# Patient Record
Sex: Female | Born: 1960 | Race: White | Hispanic: No | Marital: Married | Smoking: Never smoker
Health system: Southern US, Community
[De-identification: ages and names within clinical notes are randomized; demographics above are authoritative.]

## PROBLEM LIST (undated history)

## (undated) DIAGNOSIS — G4700 Insomnia, unspecified: Secondary | ICD-10-CM

## (undated) DIAGNOSIS — I071 Rheumatic tricuspid insufficiency: Secondary | ICD-10-CM

## (undated) DIAGNOSIS — M3500 Sicca syndrome, unspecified: Secondary | ICD-10-CM

## (undated) DIAGNOSIS — K760 Fatty (change of) liver, not elsewhere classified: Secondary | ICD-10-CM

## (undated) DIAGNOSIS — G2581 Restless legs syndrome: Secondary | ICD-10-CM

## (undated) DIAGNOSIS — E538 Deficiency of other specified B group vitamins: Secondary | ICD-10-CM

## (undated) DIAGNOSIS — H04123 Dry eye syndrome of bilateral lacrimal glands: Secondary | ICD-10-CM

## (undated) DIAGNOSIS — J309 Allergic rhinitis, unspecified: Secondary | ICD-10-CM

## (undated) DIAGNOSIS — E039 Hypothyroidism, unspecified: Secondary | ICD-10-CM

## (undated) DIAGNOSIS — M2550 Pain in unspecified joint: Secondary | ICD-10-CM

## (undated) DIAGNOSIS — R12 Heartburn: Secondary | ICD-10-CM

## (undated) DIAGNOSIS — F419 Anxiety disorder, unspecified: Secondary | ICD-10-CM

## (undated) DIAGNOSIS — Z9889 Other specified postprocedural states: Secondary | ICD-10-CM

## (undated) DIAGNOSIS — R0602 Shortness of breath: Secondary | ICD-10-CM

## (undated) DIAGNOSIS — M329 Systemic lupus erythematosus, unspecified: Secondary | ICD-10-CM

## (undated) DIAGNOSIS — J189 Pneumonia, unspecified organism: Secondary | ICD-10-CM

## (undated) DIAGNOSIS — E079 Disorder of thyroid, unspecified: Secondary | ICD-10-CM

## (undated) DIAGNOSIS — J302 Other seasonal allergic rhinitis: Secondary | ICD-10-CM

## (undated) DIAGNOSIS — K829 Disease of gallbladder, unspecified: Secondary | ICD-10-CM

## (undated) DIAGNOSIS — E559 Vitamin D deficiency, unspecified: Secondary | ICD-10-CM

## (undated) DIAGNOSIS — R001 Bradycardia, unspecified: Secondary | ICD-10-CM

## (undated) DIAGNOSIS — I10 Essential (primary) hypertension: Secondary | ICD-10-CM

## (undated) DIAGNOSIS — IMO0002 Reserved for concepts with insufficient information to code with codable children: Secondary | ICD-10-CM

## (undated) DIAGNOSIS — E7800 Pure hypercholesterolemia, unspecified: Secondary | ICD-10-CM

## (undated) DIAGNOSIS — I7300 Raynaud's syndrome without gangrene: Secondary | ICD-10-CM

## (undated) DIAGNOSIS — R7303 Prediabetes: Secondary | ICD-10-CM

## (undated) HISTORY — DX: Disorder of thyroid, unspecified: E07.9

## (undated) HISTORY — DX: Other seasonal allergic rhinitis: J30.2

## (undated) HISTORY — DX: Systemic lupus erythematosus, unspecified: M32.9

## (undated) HISTORY — DX: Sjogren syndrome, unspecified: M35.00

## (undated) HISTORY — DX: Deficiency of other specified B group vitamins: E53.8

## (undated) HISTORY — DX: Insomnia, unspecified: G47.00

## (undated) HISTORY — DX: Vitamin D deficiency, unspecified: E55.9

## (undated) HISTORY — PX: TONSILLECTOMY: SUR1361

## (undated) HISTORY — PX: NASAL SINUS SURGERY: SHX719

## (undated) HISTORY — DX: Fatty (change of) liver, not elsewhere classified: K76.0

## (undated) HISTORY — DX: Allergic rhinitis, unspecified: J30.9

## (undated) HISTORY — DX: Heartburn: R12

## (undated) HISTORY — DX: Essential (primary) hypertension: I10

## (undated) HISTORY — DX: Shortness of breath: R06.02

## (undated) HISTORY — DX: Bradycardia, unspecified: R00.1

## (undated) HISTORY — DX: Pure hypercholesterolemia, unspecified: E78.00

## (undated) HISTORY — DX: Pain in unspecified joint: M25.50

## (undated) HISTORY — DX: Disease of gallbladder, unspecified: K82.9

## (undated) HISTORY — PX: BREAST REDUCTION SURGERY: SHX8

## (undated) HISTORY — DX: Restless legs syndrome: G25.81

## (undated) HISTORY — DX: Anxiety disorder, unspecified: F41.9

## (undated) HISTORY — DX: Other specified postprocedural states: Z98.890

## (undated) HISTORY — PX: REDUCTION MAMMAPLASTY: SUR839

## (undated) HISTORY — DX: Reserved for concepts with insufficient information to code with codable children: IMO0002

## (undated) HISTORY — DX: Raynaud's syndrome without gangrene: I73.00

## (undated) HISTORY — DX: Rheumatic tricuspid insufficiency: I07.1

## (undated) HISTORY — DX: Dry eye syndrome of bilateral lacrimal glands: H04.123

## (undated) HISTORY — PX: CHOLECYSTECTOMY: SHX55

## (undated) SURGERY — VIDEO BRONCHOSCOPY WITH ENDOBRONCHIAL NAVIGATION
Anesthesia: General | Laterality: Bilateral

---

## 1998-02-16 ENCOUNTER — Other Ambulatory Visit: Admission: RE | Admit: 1998-02-16 | Discharge: 1998-02-16 | Payer: Self-pay | Admitting: Family Medicine

## 1998-03-25 ENCOUNTER — Encounter: Payer: Self-pay | Admitting: Emergency Medicine

## 1998-03-25 ENCOUNTER — Emergency Department (HOSPITAL_COMMUNITY): Admission: EM | Admit: 1998-03-25 | Discharge: 1998-03-26 | Payer: Self-pay | Admitting: Emergency Medicine

## 1999-04-30 ENCOUNTER — Other Ambulatory Visit: Admission: RE | Admit: 1999-04-30 | Discharge: 1999-04-30 | Payer: Self-pay | Admitting: Obstetrics and Gynecology

## 1999-10-16 ENCOUNTER — Encounter: Admission: RE | Admit: 1999-10-16 | Discharge: 2000-01-14 | Payer: Self-pay | Admitting: Family Medicine

## 2000-03-03 ENCOUNTER — Encounter: Admission: RE | Admit: 2000-03-03 | Discharge: 2000-06-01 | Payer: Self-pay | Admitting: Family Medicine

## 2001-01-01 ENCOUNTER — Encounter: Payer: Self-pay | Admitting: Family Medicine

## 2001-01-01 ENCOUNTER — Ambulatory Visit (HOSPITAL_COMMUNITY): Admission: RE | Admit: 2001-01-01 | Discharge: 2001-01-01 | Payer: Self-pay | Admitting: Family Medicine

## 2001-04-20 ENCOUNTER — Ambulatory Visit (HOSPITAL_COMMUNITY): Admission: RE | Admit: 2001-04-20 | Discharge: 2001-04-20 | Payer: Self-pay | Admitting: Family Medicine

## 2001-04-20 ENCOUNTER — Encounter: Payer: Self-pay | Admitting: Family Medicine

## 2002-09-07 ENCOUNTER — Ambulatory Visit (HOSPITAL_COMMUNITY): Admission: RE | Admit: 2002-09-07 | Discharge: 2002-09-07 | Payer: Self-pay | Admitting: Family Medicine

## 2002-09-07 ENCOUNTER — Encounter: Payer: Self-pay | Admitting: Family Medicine

## 2002-12-23 ENCOUNTER — Ambulatory Visit (HOSPITAL_COMMUNITY): Admission: RE | Admit: 2002-12-23 | Discharge: 2002-12-23 | Payer: Self-pay | Admitting: Family Medicine

## 2003-01-25 ENCOUNTER — Ambulatory Visit (HOSPITAL_COMMUNITY): Admission: RE | Admit: 2003-01-25 | Discharge: 2003-01-25 | Payer: Self-pay | Admitting: Family Medicine

## 2003-02-22 ENCOUNTER — Encounter: Admission: RE | Admit: 2003-02-22 | Discharge: 2003-02-22 | Payer: Self-pay | Admitting: Family Medicine

## 2003-11-30 ENCOUNTER — Ambulatory Visit (HOSPITAL_COMMUNITY): Admission: RE | Admit: 2003-11-30 | Discharge: 2003-11-30 | Payer: Self-pay | Admitting: Family Medicine

## 2003-12-15 ENCOUNTER — Ambulatory Visit: Payer: Self-pay | Admitting: Internal Medicine

## 2003-12-20 ENCOUNTER — Ambulatory Visit (HOSPITAL_COMMUNITY): Admission: RE | Admit: 2003-12-20 | Discharge: 2003-12-20 | Payer: Self-pay | Admitting: Internal Medicine

## 2004-04-25 ENCOUNTER — Encounter: Admission: RE | Admit: 2004-04-25 | Discharge: 2004-04-25 | Payer: Self-pay | Admitting: Otolaryngology

## 2004-11-09 ENCOUNTER — Ambulatory Visit: Payer: Self-pay | Admitting: Internal Medicine

## 2005-09-13 ENCOUNTER — Encounter (INDEPENDENT_AMBULATORY_CARE_PROVIDER_SITE_OTHER): Payer: Self-pay | Admitting: Specialist

## 2005-09-13 ENCOUNTER — Ambulatory Visit (HOSPITAL_COMMUNITY): Admission: RE | Admit: 2005-09-13 | Discharge: 2005-09-13 | Payer: Self-pay | Admitting: Gastroenterology

## 2006-03-19 ENCOUNTER — Encounter: Admission: RE | Admit: 2006-03-19 | Discharge: 2006-03-19 | Payer: Self-pay | Admitting: Family Medicine

## 2007-09-18 ENCOUNTER — Encounter: Admission: RE | Admit: 2007-09-18 | Discharge: 2007-09-18 | Payer: Self-pay | Admitting: Family Medicine

## 2008-01-20 ENCOUNTER — Other Ambulatory Visit: Admission: RE | Admit: 2008-01-20 | Discharge: 2008-01-20 | Payer: Self-pay | Admitting: Family Medicine

## 2008-02-25 ENCOUNTER — Observation Stay (HOSPITAL_COMMUNITY): Admission: EM | Admit: 2008-02-25 | Discharge: 2008-02-26 | Payer: Self-pay | Admitting: Emergency Medicine

## 2010-01-28 ENCOUNTER — Encounter: Payer: Self-pay | Admitting: Otolaryngology

## 2010-04-24 LAB — CARDIAC PANEL(CRET KIN+CKTOT+MB+TROPI)
CK, MB: 0.6 ng/mL (ref 0.3–4.0)
CK, MB: 0.6 ng/mL (ref 0.3–4.0)
Relative Index: INVALID (ref 0.0–2.5)
Relative Index: INVALID (ref 0.0–2.5)
Total CK: 40 U/L (ref 7–177)
Total CK: 46 U/L (ref 7–177)
Troponin I: 0.02 ng/mL (ref 0.00–0.06)
Troponin I: <0.01 ng/mL (ref 0.00–0.06)

## 2010-04-24 LAB — DIFFERENTIAL
Basophils Absolute: 0 10*3/uL (ref 0.0–0.1)
Basophils Relative: 1 % (ref 0–1)
Eosinophils Absolute: 0 10*3/uL (ref 0.0–0.7)
Eosinophils Relative: 1 % (ref 0–5)
Lymphocytes Relative: 47 % — ABNORMAL HIGH (ref 12–46)
Lymphs Abs: 1.9 10*3/uL (ref 0.7–4.0)
Monocytes Absolute: 0.2 10*3/uL (ref 0.1–1.0)
Monocytes Relative: 6 % (ref 3–12)
Neutro Abs: 1.8 10*3/uL (ref 1.7–7.7)
Neutrophils Relative %: 45 % (ref 43–77)

## 2010-04-24 LAB — CK TOTAL AND CKMB (NOT AT ARMC)
CK, MB: 0.5 ng/mL (ref 0.3–4.0)
CK, MB: 0.8 ng/mL (ref 0.3–4.0)
Relative Index: INVALID (ref 0.0–2.5)
Relative Index: INVALID (ref 0.0–2.5)
Total CK: 56 U/L (ref 7–177)
Total CK: 61 U/L (ref 7–177)

## 2010-04-24 LAB — ABO/RH: ABO/RH(D): A NEG

## 2010-04-24 LAB — CBC
HCT: 36 % (ref 36.0–46.0)
Hemoglobin: 12.7 g/dL (ref 12.0–15.0)
MCHC: 35.2 g/dL (ref 30.0–36.0)
MCV: 87 fL (ref 78.0–100.0)
Platelets: 229 10*3/uL (ref 150–400)
RBC: 4.13 MIL/uL (ref 3.87–5.11)
RDW: 13.1 % (ref 11.5–15.5)
WBC: 3.9 10*3/uL — ABNORMAL LOW (ref 4.0–10.5)

## 2010-04-24 LAB — CROSSMATCH
ABO/RH(D): A NEG
Antibody Screen: NEGATIVE

## 2010-04-24 LAB — POCT I-STAT, CHEM 8
BUN: 9 mg/dL (ref 6–23)
Calcium, Ion: 1.22 mmol/L (ref 1.12–1.32)
Chloride: 105 mEq/L (ref 96–112)
Creatinine, Ser: 0.9 mg/dL (ref 0.4–1.2)
Glucose, Bld: 97 mg/dL (ref 70–99)
HCT: 38 % (ref 36.0–46.0)
Hemoglobin: 12.9 g/dL (ref 12.0–15.0)
Potassium: 3.6 mEq/L (ref 3.5–5.1)
Sodium: 143 mEq/L (ref 135–145)
TCO2: 27 mmol/L (ref 0–100)

## 2010-04-24 LAB — TROPONIN I
Troponin I: 0.01 ng/mL (ref 0.00–0.06)
Troponin I: <0.01 ng/mL (ref 0.00–0.06)

## 2010-04-24 LAB — D-DIMER, QUANTITATIVE (NOT AT ARMC): D-Dimer, Quant: 0.79 ug/mL-FEU — ABNORMAL HIGH (ref 0.00–0.48)

## 2010-05-22 NOTE — H&P (Signed)
Jill Mcintosh, Jill Mcintosh            ACCOUNT NO.:  [id number]   MEDICAL RECORD NO.:  [id number]          PATIENT TYPE:  EMS   LOCATION:  [facility]                         FACILITY:  [facility]   PHYSICIAN:  Hollice Espy, M.D.DATE OF BIRTH:  02/04/1960   DATE OF ADMISSION:  02/25/2008  DATE OF DISCHARGE:                              HISTORY & PHYSICAL   PRIMARY CARE PHYSICIAN:  Dario Guardian, M.D.   CHIEF COMPLAINT:  Chest pain.   HISTORY OF PRESENT ILLNESS:  The patient is a 50 year old white female  with past medical history of hypertension, hyperlipidemia, and  hypothyroidism who last night started having severe left-sided chest  pain described as a heavy, heavy pressure over her left breast.  It was  intermittent in nature, but never completely going away.  Several times  she thought about calling 9-1-1 but changed her mind.  This morning when  she woke up the pain was still persisting, although much improved from  previous, but she decided to come into the emergency room at that point.  When she came in the emergency room, labs were checked.  She was noted  to have a D-dimer of 0.79, but cardiac markers, CBC and BMET were all  unremarkable.  She had normal shift differential.  A chest x-ray done  showed some left base airspace disease consistent with a pneumonia,  although the patient had no signs of a cough or shortness of breath.  Because of her elevated D-dimer, she underwent a V/Q scan which noted no  evidence of any pulmonary embolus, but again some persistent left  basilar airspace infiltrate.  At this point, it was decided to go ahead  and prophylactically treat the patient for pneumonia.  In addition her  chest pain had resolved, the cardiac markers were unremarkable, because  she had some risk factors, it was felt best that she come in for further  evaluation and treatment.  Her EKG was reported by the ER doctor to show  no signs of any ST or T-wave elevation, although  currently is not  present on the chart and will review it before the patient is  transferred upstairs.  Currently she is doing okay.  She denies any  headaches, vision changes, dysphagia, no current chest pain or  palpitations or shortness of breath, wheeze, cough, abdominal pain,  hematuria, dysuria, constipation, diarrhea, focal extremity numbness,  weakness or pain.  Review of systems otherwise negative.   PAST MEDICAL HISTORY:  1. Includes hypertension.  2. Hyperlipidemia.  3. Hypothyroidism.  4. History of mild tricuspid regurgitation.  5. 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   MEDICATIONS:  She is on:  1. Synthroid 137 mcg plus a 25 mcg tablet p.o. daily.  2. Premarin 0.625 p.o. intravaginally twice a week.  3. ProctoFoam t.i.d. p.r.n.  4. Folate 1 mg p.o. daily.  5. Ursodiol 500 p.o. daily.  6. Hydroxychloroquine 200 p.o. b.i.d.  7. She stopped taking methotrexate in January, 2010.  8. Multivitamin p.o. daily.  9. Omega-3, 1200 p.o. daily.  10.Acai 1000 p.o. daily.  11.Milk thistle 1000 p.o. daily.  12.Restasis 0.05% one drop affected eye p.r.n.  13.Artificial tears p.r.n.  14.Lisinopril/HCTZ 10/12.5 p.o. daily.   She has no known drug allergies.   SOCIAL HISTORY:  She denies any tobacco, alcohol or drug use.   FAMILY HISTORY:  Noted for a dad with CAD in his 52s and a brother who  has had 3 stents put in, his first stent put in when he was  approximately 42.   PHYSICAL EXAMINATION:  VITALS ON ADMISSION:  Temp 97.7, heart rate 67,  blood pressure 139/66, respirations 16, O2 sat 100% on room air.  GENERAL:  She is alert and oriented x3, in no apparent distress.  HEENT:  Normocephalic, atraumatic.  Her mucous membranes are moist.  She  has no carotid bruits.  HEART:  Regular rate and rhythm, S1-S2.  LUNGS:  Clear to auscultation bilaterally.  ABDOMEN:  Soft, nontender, nondistended.  Positive bowel sounds.  EXTREMITIES:  Show no  clubbing, cyanosis, trace pitting edema.   LAB WORK:  White count 3.9, H and H 12.7 and 36, MCV of 87, platelet  count 229, 45% neutrophils, D-dimer is 0.79.  Sodium 142, potassium 3.6,  chloride 105, BUN 9, creatinine 0.9, glucose 97.  CPK is 56, MB 0.8,  troponin-I 0.01.  Chest x-ray is as per HPI.  CT of the chest is as per  HPI.  EKG will confirm ER report that this just shows a normal sinus  rhythm.   ASSESSMENT AND PLAN:  1. Chest pain:  Patient with multiple risk factors including      hypertension, hyperlipidemia and family history.  We plan to check      3 sets of enzymes, if she remains chest pain free, we will      discharge home with outpatient stress test.  2. Hypertension:  Continue home meds.  3. Hyperlipidemia:  Please note that the patient did have a recent      lipid profile done approximately a little over 1 month ago.  At      that time she was noted to have a cholesterol of 214, triglycerides      158, LDL of 130 and an HDL of 36.  Her PCP is following her for      this and continuing weight loss and at this time will hold on      starting her on a statin.  4. Hypothyroidism:  Continue Synthroid.      Hollice Espy, M.D.  Electronically Signed     SKK/MEDQ  D:  02/25/2008  T:  02/25/2008  Job:  [id number]   cc:   Dario Guardian, M.D.

## 2011-02-25 ENCOUNTER — Other Ambulatory Visit: Payer: Self-pay | Admitting: Family Medicine

## 2011-02-25 DIAGNOSIS — Z1231 Encounter for screening mammogram for malignant neoplasm of breast: Secondary | ICD-10-CM

## 2011-03-18 ENCOUNTER — Ambulatory Visit
Admission: RE | Admit: 2011-03-18 | Discharge: 2011-03-18 | Disposition: A | Discharge status: Home / Self Care | Payer: BC Managed Care – PPO | Source: Ambulatory Visit | Attending: Family Medicine | Admitting: Family Medicine

## 2011-03-18 DIAGNOSIS — Z1231 Encounter for screening mammogram for malignant neoplasm of breast: Secondary | ICD-10-CM

## 2013-01-19 ENCOUNTER — Other Ambulatory Visit: Payer: Self-pay

## 2013-01-19 DIAGNOSIS — Z1231 Encounter for screening mammogram for malignant neoplasm of breast: Secondary | ICD-10-CM

## 2013-02-23 ENCOUNTER — Ambulatory Visit
Admission: RE | Admit: 2013-02-23 | Discharge: 2013-02-23 | Disposition: A | Discharge status: Home / Self Care | Payer: BC Managed Care – PPO | Source: Ambulatory Visit

## 2013-02-23 DIAGNOSIS — Z1231 Encounter for screening mammogram for malignant neoplasm of breast: Secondary | ICD-10-CM

## 2013-04-19 ENCOUNTER — Other Ambulatory Visit (HOSPITAL_COMMUNITY)
Admission: RE | Admit: 2013-04-19 | Discharge: 2013-04-19 | Disposition: A | Discharge status: Home / Self Care | Payer: BC Managed Care – PPO | Source: Ambulatory Visit | Attending: Family Medicine | Admitting: Family Medicine

## 2013-04-19 ENCOUNTER — Other Ambulatory Visit: Payer: Self-pay | Admitting: Family Medicine

## 2013-04-19 DIAGNOSIS — Z124 Encounter for screening for malignant neoplasm of cervix: Secondary | ICD-10-CM | POA: Insufficient documentation

## 2014-06-07 ENCOUNTER — Ambulatory Visit (INDEPENDENT_AMBULATORY_CARE_PROVIDER_SITE_OTHER): Payer: BC Managed Care – PPO | Admitting: Cardiology

## 2014-06-07 ENCOUNTER — Encounter: Payer: Self-pay | Admitting: Cardiology

## 2014-06-07 VITALS — BP 150/76 | HR 70 | Ht 61.0 in | Wt 170.8 lb

## 2014-06-07 DIAGNOSIS — I10 Essential (primary) hypertension: Secondary | ICD-10-CM | POA: Diagnosis not present

## 2014-06-07 DIAGNOSIS — R079 Chest pain, unspecified: Secondary | ICD-10-CM | POA: Diagnosis not present

## 2014-06-07 DIAGNOSIS — R001 Bradycardia, unspecified: Secondary | ICD-10-CM | POA: Diagnosis not present

## 2014-06-07 DIAGNOSIS — M329 Systemic lupus erythematosus, unspecified: Secondary | ICD-10-CM | POA: Insufficient documentation

## 2014-06-07 NOTE — Progress Notes (Signed)
Cardiology Office Note   Date:  06/07/2014   ID:  Jill Mcintosh, DOB December 17, 1960, MRN [id number]  PCP:  No primary care provider on file.  Cardiologist:   Quintella Reichert, MD   Chief Complaint  Patient presents with  . New Evaluation    Bradycardia      History of Present Illness: Jill Mcintosh is a 54 y.o. female who presents for evaluation of bradycardia.  She has a history of HTN, dyslipidemia, hypothyroidism, SLE and Sjogren's with Raynaud's syndrome.  She has noticed when checking her BP that her HR has at times been low.  She has noticed that her HR drops into the 50's when she is sitting at home checking her BP.  She denies SOB but has noticed some tightness in her chest.  She says that usually it is nonexertional but can come on with household chores.  She has chronic pain with her lupus.  She denies any SOB, nausea or diaphoresis with the chest discomfort.  On last OV with the PCP her HR was 68bpm.  She has noticed some lightheadedness when she goes from bending over and standing back up.  She denies any syncope.  She is chronically fatigued which she attributes to her lupus.   Past Medical History  Diagnosis Date  . Lupus   . Hypertension   . H/O bilateral breast reduction surgery   . Raynaud's syndrome   . Thyroid disease   . Insomnia   . Sjogren's disease   . Bradycardia   . Sicca syndrome   . Anxiety   . Vitamin D deficiency   . Mild tricuspid regurgitation   . Allergic rhinitis     Past Surgical History  Procedure Laterality Date  . Tonsillectomy    . Nasal sinus surgery    . Breast reduction surgery    . Cholecystectomy       Current Outpatient Prescriptions  Medication Sig Dispense Refill  . acetaminophen-codeine (TYLENOL #3) 300-30 MG per tablet Take 1 tablet by mouth as needed. For pain    . Calcium-Vitamin D 600-200 MG-UNIT per tablet Take 1 tablet by mouth daily.    . cycloSPORINE (RESTASIS) 0.05 % ophthalmic emulsion 1 drop 2 (two)  times daily.    . Flaxseed, Linseed, (FLAX SEED OIL) 1000 MG CAPS Take 1 capsule by mouth daily.    . folic acid (FOLVITE) 1 MG tablet Take 1 mg by mouth daily.    . Glucosamine 500 MG CAPS Take 500 mg by mouth 3 (three) times daily.    . hydroxychloroquine (PLAQUENIL) 200 MG tablet Take 200 mg by mouth 2 (two) times daily.     . hydroxypropyl methylcellulose / hypromellose (ISOPTO TEARS / GONIOVISC) 2.5 % ophthalmic solution 1 drop. Use as directed    . ibuprofen (ADVIL,MOTRIN) 200 MG tablet Take 200 mg by mouth every 6 (six) hours as needed.    Marland Kitchen levothyroxine (SYNTHROID, LEVOTHROID) 137 MCG tablet Take 137 mcg by mouth daily before breakfast.    . lisinopril-hydrochlorothiazide (PRINZIDE,ZESTORETIC) 10-12.5 MG per tablet Take 1 tablet by mouth daily.    . meloxicam (MOBIC) 15 MG tablet Take 15 mg by mouth as needed. For inflammation    . Methotrexate Sodium (METHOTREXATE, PF,) 50 MG/2ML injection Inject 0.05 mLs into the muscle once a week.    . Multiple Vitamins-Minerals (CENTRUM ADULTS PO) Take 1 tablet by mouth daily.    . Omega 3 1000 MG CAPS Take 1 capsule by mouth daily.    Marland Kitchen  polyethylene glycol powder (GLYCOLAX/MIRALAX) powder Take 1 Container by mouth as needed. For constipation    . pravastatin (PRAVACHOL) 20 MG tablet Take 20 mg by mouth daily.    . sodium chloride (MURO 128) 5 % ophthalmic solution Place 1 drop into both eyes 2 (two) times daily.    . traMADol (ULTRAM) 50 MG tablet Take 50 mg by mouth every 6 (six) hours as needed for moderate pain.    Marland Kitchen. zolpidem (AMBIEN) 5 MG tablet Take 5 mg by mouth at bedtime as needed for sleep.     No current facility-administered medications for this visit.    Allergies:   Darvon    Social History:  The patient  reports that she has never smoked. She does not have any smokeless tobacco history on file. She reports that she drinks alcohol. She reports that she does not use illicit drugs.   Family History:  The patient's family history  includes Diabetes in her brother and paternal grandmother; Heart attack in her father and paternal grandfather; Heart disease in her brother; Hyperlipidemia in her father and mother; Hypertension in her brother, brother, father, mother, and paternal grandmother.    ROS:  Please see the history of present illness.   Otherwise, review of systems are positive for none.   All other systems are reviewed and negative.    PHYSICAL EXAM: VS:  BP 150/76 mmHg  Pulse 70  Ht 5\' 1"  (1.549 m)  Wt 170 lb 12.8 oz (77.474 kg)  BMI 32.29 kg/m2 , BMI Body mass index is 32.29 kg/(m^2). GEN: Well nourished, well developed, in no acute distress HEENT: normal Neck: no JVD, carotid bruits, or masses Cardiac: RRR; no  rubs, or gallops,no edema.  1/6 SM at RUSB Respiratory:  clear to auscultation bilaterally, normal work of breathing GI: soft, nontender, nondistended, + BS MS: no deformity or atrophy Skin: warm and dry, no rash Neuro:  Strength and sensation are intact Psych: euthymic mood, full affect   EKG:  EKG is ordered today. The ekg ordered today demonstrates NSR at 70bpm with nonspecific T wave abnormality   Recent Labs: No results found for requested labs within last 365 days.    Lipid Panel No results found for: CHOL, TRIG, HDL, CHOLHDL, VLDL, LDLCALC, LDLDIRECT    Wt Readings from Last 3 Encounters:  06/07/14 170 lb 12.8 oz (77.474 kg)       ASSESSMENT AND PLAN:  1.  Bradycardia documented by the patient when taking her BP at home.  On last OV with PCP her HR was 68 bpm.  She is completely asymptomatic except for dizziness when going from sitting or bending over to standing.  I will get a 48 hour Holter monitor to assess average heart rate. 2.  Connective tissue disease with Lupus and Sjogren's. 3.  HTN - borderline elevated 4.  Chest discomfort that is somewhat atypical and most likely related to her underlying chronic pain with SLE.  I will get a stress myoview to rule out ischemia  and also assess her chronotropic response to exercise.  I will also get a 2D echo to assess for pericardial effusion given her SLE.   Current medicines are reviewed at length with the patient today.  The patient does not have concerns regarding medicines.  The following changes have been made:  no change  Labs/ tests ordered today include: 48 hour Holter monitor, stress myoview, 2D echo  Orders Placed This Encounter  Procedures  . Myocardial Perfusion Imaging  .  Holter monitor - 48 hour  . EKG 12-Lead  . Echocardiogram     Disposition:   FU with me PRM pending results of studies.   Harlon Flor, MD  06/07/2014 2:14 PM    [facility] [address] [place], [place], [place]  [address] Phone: [phone]; Fax: [phone]

## 2014-06-07 NOTE — Patient Instructions (Signed)
Medication Instructions:  Your physician recommends that you continue on your current medications as directed. Please refer to the Current Medication list given to you today.   Labwork: None  Testing/Procedures: Your physician has requested that you have an echocardiogram. Echocardiography is a painless test that uses sound waves to create images of your heart. It provides your doctor with information about the size and shape of your heart and how well your heart's chambers and valves are working. This procedure takes approximately one hour. There are no restrictions for this procedure.   Dr. Mayford Knifeurner recommends you have a NUCLEAR STRESS TEST.  Your physician has recommended that you wear a holter monitor. Holter monitors are medical devices that record the heart's electrical activity. Doctors most often use these monitors to diagnose arrhythmias. Arrhythmias are problems with the speed or rhythm of the heartbeat. The monitor is a small, portable device. You can wear one while you do your normal daily activities. This is usually used to diagnose what is causing palpitations/syncope (passing out).  Follow-Up: Your physician recommends that you schedule a follow-up appointment AS NEEDED with Dr. Mayford Knifeurner pending your study results.   Any Other Special Instructions Will Be Listed Below (If Applicable).

## 2014-06-13 ENCOUNTER — Ambulatory Visit (INDEPENDENT_AMBULATORY_CARE_PROVIDER_SITE_OTHER): Payer: BC Managed Care – PPO

## 2014-06-13 ENCOUNTER — Ambulatory Visit (HOSPITAL_COMMUNITY): Payer: BC Managed Care – PPO | Attending: Cardiovascular Disease

## 2014-06-13 ENCOUNTER — Other Ambulatory Visit: Payer: Self-pay

## 2014-06-13 DIAGNOSIS — R079 Chest pain, unspecified: Secondary | ICD-10-CM | POA: Diagnosis not present

## 2014-06-13 DIAGNOSIS — R001 Bradycardia, unspecified: Secondary | ICD-10-CM | POA: Diagnosis not present

## 2014-06-13 DIAGNOSIS — I517 Cardiomegaly: Secondary | ICD-10-CM | POA: Insufficient documentation

## 2014-06-16 ENCOUNTER — Telehealth (HOSPITAL_COMMUNITY): Payer: Self-pay

## 2014-06-16 NOTE — Telephone Encounter (Signed)
Left message on voicemail per DPR in reference to upcoming appointment scheduled on 06-21-2014 at 8:15am with detailed instructions given per Myocardial Perfusion Study Information Sheet for the test. LM to arrive 15 minutes early, and that it is imperative to arrive on time for appointment to keep from having the test rescheduled.Phone number given for call back for any questions. Randa Evens, Edgard Debord A

## 2014-06-21 ENCOUNTER — Ambulatory Visit (HOSPITAL_COMMUNITY): Payer: BC Managed Care – PPO | Attending: Cardiology

## 2014-06-21 DIAGNOSIS — R079 Chest pain, unspecified: Secondary | ICD-10-CM | POA: Insufficient documentation

## 2014-06-21 LAB — MYOCARDIAL PERFUSION IMAGING
Estimated workload: 10.1 METS
Exercise duration (min): 9 min
Exercise duration (sec): 0 s
LV dias vol: 99 mL
LV sys vol: 32 mL
MPHR: 167 {beats}/min
Nuc Stress EF: 67 %
Peak HR: 162 {beats}/min
Percent HR: 97 %
RATE: 0.38
Rest HR: 65 {beats}/min
SDS: 1
SRS: 0
SSS: 1
TID: 0.87

## 2014-06-21 MED ORDER — TECHNETIUM TC 99M SESTAMIBI GENERIC - CARDIOLITE
33.0000 | Freq: Once | INTRAVENOUS | Status: AC | PRN
  Administered 2014-06-21: 33 via INTRAVENOUS

## 2014-06-21 MED ORDER — TECHNETIUM TC 99M SESTAMIBI GENERIC - CARDIOLITE
11.0000 | Freq: Once | INTRAVENOUS | Status: AC | PRN
  Administered 2014-06-21: 11 via INTRAVENOUS

## 2014-08-09 ENCOUNTER — Other Ambulatory Visit: Payer: Self-pay

## 2014-08-09 DIAGNOSIS — Z1231 Encounter for screening mammogram for malignant neoplasm of breast: Secondary | ICD-10-CM

## 2014-08-16 ENCOUNTER — Ambulatory Visit: Payer: BC Managed Care – PPO

## 2014-08-22 ENCOUNTER — Ambulatory Visit
Admission: RE | Admit: 2014-08-22 | Discharge: 2014-08-22 | Disposition: A | Discharge status: Home / Self Care | Payer: BC Managed Care – PPO | Source: Ambulatory Visit

## 2014-08-22 DIAGNOSIS — Z1231 Encounter for screening mammogram for malignant neoplasm of breast: Secondary | ICD-10-CM

## 2014-10-27 ENCOUNTER — Other Ambulatory Visit: Payer: Self-pay

## 2014-10-27 ENCOUNTER — Emergency Department (HOSPITAL_COMMUNITY): Payer: BC Managed Care – PPO

## 2014-10-27 ENCOUNTER — Emergency Department (HOSPITAL_COMMUNITY)
Admission: EM | Admit: 2014-10-27 | Discharge: 2014-10-27 | Disposition: A | Discharge status: Home / Self Care | Payer: BC Managed Care – PPO | Attending: Emergency Medicine | Admitting: Emergency Medicine

## 2014-10-27 ENCOUNTER — Encounter (HOSPITAL_COMMUNITY): Payer: Self-pay | Admitting: Emergency Medicine

## 2014-10-27 DIAGNOSIS — F419 Anxiety disorder, unspecified: Secondary | ICD-10-CM | POA: Diagnosis not present

## 2014-10-27 DIAGNOSIS — E559 Vitamin D deficiency, unspecified: Secondary | ICD-10-CM | POA: Insufficient documentation

## 2014-10-27 DIAGNOSIS — R1013 Epigastric pain: Secondary | ICD-10-CM | POA: Diagnosis not present

## 2014-10-27 DIAGNOSIS — R0602 Shortness of breath: Secondary | ICD-10-CM | POA: Diagnosis not present

## 2014-10-27 DIAGNOSIS — G4700 Insomnia, unspecified: Secondary | ICD-10-CM | POA: Insufficient documentation

## 2014-10-27 DIAGNOSIS — Z79899 Other long term (current) drug therapy: Secondary | ICD-10-CM | POA: Diagnosis not present

## 2014-10-27 DIAGNOSIS — R079 Chest pain, unspecified: Secondary | ICD-10-CM | POA: Insufficient documentation

## 2014-10-27 DIAGNOSIS — E079 Disorder of thyroid, unspecified: Secondary | ICD-10-CM | POA: Diagnosis not present

## 2014-10-27 DIAGNOSIS — Z8739 Personal history of other diseases of the musculoskeletal system and connective tissue: Secondary | ICD-10-CM | POA: Insufficient documentation

## 2014-10-27 DIAGNOSIS — R0789 Other chest pain: Secondary | ICD-10-CM

## 2014-10-27 DIAGNOSIS — I10 Essential (primary) hypertension: Secondary | ICD-10-CM | POA: Insufficient documentation

## 2014-10-27 LAB — CBC
HCT: 37.4 % (ref 36.0–46.0)
Hemoglobin: 12.7 g/dL (ref 12.0–15.0)
MCH: 29.6 pg (ref 26.0–34.0)
MCHC: 34 g/dL (ref 30.0–36.0)
MCV: 87.2 fL (ref 78.0–100.0)
Platelets: 229 10*3/uL (ref 150–400)
RBC: 4.29 MIL/uL (ref 3.87–5.11)
RDW: 13.9 % (ref 11.5–15.5)
WBC: 5.3 10*3/uL (ref 4.0–10.5)

## 2014-10-27 LAB — URINALYSIS, ROUTINE W REFLEX MICROSCOPIC
Bilirubin Urine: NEGATIVE
Glucose, UA: NEGATIVE mg/dL
Hgb urine dipstick: NEGATIVE
Ketones, ur: 15 mg/dL — AB
Leukocytes, UA: NEGATIVE
Nitrite: NEGATIVE
Protein, ur: NEGATIVE mg/dL
Specific Gravity, Urine: 1.023 (ref 1.005–1.030)
Urobilinogen, UA: 0.2 mg/dL (ref 0.0–1.0)
pH: 6 (ref 5.0–8.0)

## 2014-10-27 LAB — I-STAT TROPONIN, ED: Troponin i, poc: 0 ng/mL (ref 0.00–0.08)

## 2014-10-27 LAB — BASIC METABOLIC PANEL
Anion gap: 9 (ref 5–15)
BUN: 10 mg/dL (ref 6–20)
CO2: 23 mmol/L (ref 22–32)
Calcium: 9.7 mg/dL (ref 8.9–10.3)
Chloride: 104 mmol/L (ref 101–111)
Creatinine, Ser: 0.72 mg/dL (ref 0.44–1.00)
GFR calc Af Amer: >60 mL/min (ref >60)
GFR calc non Af Amer: >60 mL/min (ref >60)
Glucose, Bld: 96 mg/dL (ref 65–99)
Potassium: 3.5 mmol/L (ref 3.5–5.1)
Sodium: 136 mmol/L (ref 135–145)

## 2014-10-27 LAB — LIPASE, BLOOD: Lipase: 37 U/L (ref 11–51)

## 2014-10-27 LAB — HEPATIC FUNCTION PANEL
ALT: 25 U/L (ref 14–54)
AST: 30 U/L (ref 15–41)
Albumin: 3.6 g/dL (ref 3.5–5.0)
Alkaline Phosphatase: 44 U/L (ref 38–126)
Bilirubin, Direct: 0.2 mg/dL (ref 0.1–0.5)
Indirect Bilirubin: 0.7 mg/dL (ref 0.3–0.9)
Total Bilirubin: 0.9 mg/dL (ref 0.3–1.2)
Total Protein: 7.4 g/dL (ref 6.5–8.1)

## 2014-10-27 LAB — TROPONIN I: Troponin I: <0.03 ng/mL (ref <0.031)

## 2014-10-27 LAB — BRAIN NATRIURETIC PEPTIDE: B Natriuretic Peptide: 16.9 pg/mL (ref 0.0–100.0)

## 2014-10-27 LAB — I-STAT CG4 LACTIC ACID, ED: Lactic Acid, Venous: 1.06 mmol/L (ref 0.5–2.0)

## 2014-10-27 MED ORDER — ACETAMINOPHEN 500 MG PO TABS
1000.0000 mg | ORAL_TABLET | Freq: Once | ORAL | Status: AC
  Administered 2014-10-27: 1000 mg via ORAL
  Filled 2014-10-27: qty 2

## 2014-10-27 MED ORDER — SODIUM CHLORIDE 0.9 % IV BOLUS (SEPSIS)
1000.0000 mL | Freq: Once | INTRAVENOUS | Status: AC
  Administered 2014-10-27: 1000 mL via INTRAVENOUS

## 2014-10-27 MED ORDER — IOHEXOL 300 MG/ML  SOLN
25.0000 mL | Freq: Once | INTRAMUSCULAR | Status: AC | PRN
  Administered 2014-10-27: 25 mL via ORAL

## 2014-10-27 MED ORDER — SODIUM CHLORIDE 0.9 % IV BOLUS (SEPSIS)
500.0000 mL | Freq: Once | INTRAVENOUS | Status: AC
  Administered 2014-10-27: 500 mL via INTRAVENOUS

## 2014-10-27 MED ORDER — SODIUM CHLORIDE 0.9 % IV BOLUS (SEPSIS): 500.0000 mL | Freq: Once | INTRAVENOUS | Status: DC

## 2014-10-27 MED ORDER — ACETAMINOPHEN 500 MG PO TABS: 500.0000 mg | ORAL_TABLET | Freq: Four times a day (QID) | ORAL | Status: DC | PRN

## 2014-10-27 MED ORDER — PANTOPRAZOLE SODIUM 40 MG IV SOLR
40.0000 mg | Freq: Once | INTRAVENOUS | Status: AC
  Administered 2014-10-27: 40 mg via INTRAVENOUS
  Filled 2014-10-27: qty 40

## 2014-10-27 MED ORDER — IOHEXOL 300 MG/ML  SOLN
100.0000 mL | Freq: Once | INTRAMUSCULAR | Status: AC | PRN
  Administered 2014-10-27: 100 mL via INTRAVENOUS

## 2014-10-27 MED ORDER — SUCRALFATE 1 G PO TABS: 1.0000 g | ORAL_TABLET | Freq: Four times a day (QID) | ORAL | Status: DC

## 2014-10-27 MED ORDER — PANTOPRAZOLE SODIUM 20 MG PO TBEC: 20.0000 mg | DELAYED_RELEASE_TABLET | Freq: Every day | ORAL | Status: DC

## 2014-10-27 NOTE — ED Provider Notes (Signed)
CSN: [id number]     Arrival date & time 10/27/14  3875 History   First MD Initiated Contact with Patient 10/27/14 [phone]     Chief Complaint  Patient presents with  . Chest Pain  . Shortness of Breath     (Consider location/radiation/quality/duration/timing/severity/associated sxs/prior Treatment) HPI   Jill Mcintosh is a 54 y.o. female with PMH significant for lupus, HTN, sjogren's disease, and anxiety who presents with gradual persistent worsening shortness of breath and difficulty with inspiration that began yesterday.  Patient states she was unable to lie flat last night.  Patient states that she has difficulty taking a deep breath, and she experiences posterior bilateral stabbing chest pain.  Today on the way to her MD, she began experiencing non-radiating stabbing substernal chest pain.  Denies N/V/D, palpitations, diaphoresis, abdominal pain, fevers, chills, or urinary frequency.  Patient reports that she has had pleural effusions in the past and that this feels similar.   Past Medical History  Diagnosis Date  . Lupus (HCC)   . Hypertension   . H/O bilateral breast reduction surgery   . Raynaud's syndrome   . Thyroid disease   . Insomnia   . Sjogren's disease (HCC)   . Bradycardia   . Sicca syndrome (HCC)   . Anxiety   . Vitamin D deficiency   . Mild tricuspid regurgitation   . Allergic rhinitis    Past Surgical History  Procedure Laterality Date  . Tonsillectomy    . Nasal sinus surgery    . Breast reduction surgery    . Cholecystectomy     Family History  Problem Relation Age of Onset  . Hyperlipidemia Mother   . Hypertension Mother   . Heart attack Father   . Hyperlipidemia Father   . Hypertension Father   . Hypertension Brother   . Diabetes Brother   . Heart disease Brother   . Hypertension Paternal Grandmother   . Diabetes Paternal Grandmother   . Heart attack Paternal Grandfather   . Hypertension Brother    Social History  Substance Use Topics   . Smoking status: Never Smoker   . Smokeless tobacco: None  . Alcohol Use: 0.0 oz/week    0 Standard drinks or equivalent per week     Comment: rarely   OB History    No data available     Review of Systems  All other systems negative unless otherwise stated in HPI   Allergies  Darvon  Home Medications   Prior to Admission medications   Medication Sig Start Date End Date Taking? Authorizing Provider  acetaminophen-codeine (TYLENOL #3) 300-30 MG per tablet Take 1 tablet by mouth as needed. For pain 05/26/14  Yes Historical Provider, MD  ARTIFICIAL TEAR SOLUTION OP Apply 1 drop to eye daily as needed (dryness).   Yes Historical Provider, MD  Calcium-Vitamin D 600-200 MG-UNIT per tablet Take 1 tablet by mouth daily.   Yes Historical Provider, MD  cycloSPORINE (RESTASIS) 0.05 % ophthalmic emulsion 1 drop 2 (two) times daily.   Yes Historical Provider, MD  Flaxseed, Linseed, (FLAX SEED OIL) 1000 MG CAPS Take 1 capsule by mouth daily.   Yes Historical Provider, MD  folic acid (FOLVITE) 1 MG tablet Take 2 mg by mouth daily.  05/25/14  Yes Historical Provider, MD  Glucosamine 500 MG CAPS Take 500 mg by mouth 3 (three) times daily.   Yes Historical Provider, MD  hydroxypropyl methylcellulose / hypromellose (ISOPTO TEARS / GONIOVISC) 2.5 % ophthalmic solution 1 drop.  Use as directed   Yes Historical Provider, MD  ibuprofen (ADVIL,MOTRIN) 200 MG tablet Take 200 mg by mouth every 6 (six) hours as needed.   Yes Historical Provider, MD  levothyroxine (SYNTHROID, LEVOTHROID) 137 MCG tablet Take 137 mcg by mouth daily before breakfast.   Yes Historical Provider, MD  lisinopril-hydrochlorothiazide (PRINZIDE,ZESTORETIC) 10-12.5 MG per tablet Take 1 tablet by mouth daily.   Yes Historical Provider, MD  meloxicam (MOBIC) 15 MG tablet Take 15 mg by mouth as needed. For inflammation 04/08/14  Yes Historical Provider, MD  Methotrexate Sodium (METHOTREXATE, PF,) 50 MG/2ML injection Inject 0.05 mLs into the  muscle once a week. 05/25/14  Yes Historical Provider, MD  Multiple Vitamins-Minerals (CENTRUM ADULTS PO) Take 1 tablet by mouth daily.   Yes Historical Provider, MD  Omega 3 1000 MG CAPS Take 1 capsule by mouth daily.   Yes Historical Provider, MD  phentermine 37.5 MG capsule Take 37.5 mg by mouth every morning.   Yes Historical Provider, MD  polyethylene glycol powder (GLYCOLAX/MIRALAX) powder Take 1 Container by mouth as needed. For constipation 03/15/14  Yes Historical Provider, MD  pravastatin (PRAVACHOL) 20 MG tablet Take 20 mg by mouth daily.   Yes Historical Provider, MD  sodium chloride (MURO 128) 5 % ophthalmic solution Place 1 drop into both eyes 2 (two) times daily.   Yes Historical Provider, MD  sodium chloride (OCEAN) 0.65 % SOLN nasal spray Place 1 spray into both nostrils as needed for congestion.   Yes Historical Provider, MD  traMADol (ULTRAM) 50 MG tablet Take 50 mg by mouth every 6 (six) hours as needed for moderate pain.   Yes Historical Provider, MD  zolpidem (AMBIEN) 5 MG tablet Take 5 mg by mouth at bedtime as needed for sleep.   Yes Historical Provider, MD  acetaminophen (TYLENOL) 500 MG tablet Take 1 tablet (500 mg total) by mouth every 6 (six) hours as needed. 10/27/14   Cheri Fowler, PA-C  pantoprazole (PROTONIX) 20 MG tablet Take 1 tablet (20 mg total) by mouth daily. 10/27/14   Cheri Fowler, PA-C  sucralfate (CARAFATE) 1 G tablet Take 1 tablet (1 g total) by mouth 4 (four) times daily. 10/27/14   Janyra Barillas, PA-C   BP 100/63 mmHg  Pulse 74  Temp(Src) 99.1 F (37.3 C) (Oral)  Resp 21  SpO2 96% Physical Exam  Constitutional: She is oriented to person, place, and time. She appears well-developed and well-nourished.  HENT:  Head: Normocephalic and atraumatic.  Mouth/Throat: Oropharynx is clear and moist.  Eyes: Conjunctivae are normal. Pupils are equal, round, and reactive to light.  Neck: Normal range of motion. Neck supple.  Cardiovascular: Normal rate, regular rhythm,  normal heart sounds and intact distal pulses.   No murmur heard. Pulmonary/Chest: Effort normal and breath sounds normal. No accessory muscle usage or stridor. No respiratory distress. She has no wheezes. She has no rhonchi. She has no rales.  Abdominal: Soft. Bowel sounds are normal. She exhibits no distension. There is tenderness in the epigastric area.  Musculoskeletal: Normal range of motion.  No lower extremity edema.   Lymphadenopathy:    She has no cervical adenopathy.  Neurological: She is alert and oriented to person, place, and time.  Speech clear without dysarthria.  Skin: Skin is warm and dry.  Psychiatric: She has a normal mood and affect. Her behavior is normal.    ED Course  Procedures (including critical care time) Labs Review Labs Reviewed  BASIC METABOLIC PANEL  TROPONIN I  CBC  BRAIN NATRIURETIC PEPTIDE  LIPASE, BLOOD  HEPATIC FUNCTION PANEL  URINALYSIS, ROUTINE W REFLEX MICROSCOPIC (NOT AT [facility])  I-STAT TROPOININ, ED  I-STAT CG4 LACTIC ACID, ED  I-STAT CG4 LACTIC ACID, ED    Imaging Review Dg Chest 2 View  10/27/2014  CLINICAL DATA:  Chest pain and shortness of breath since yesterday. EXAM: CHEST  2 VIEW COMPARISON:  10/26/2010 FINDINGS: The heart size and mediastinal contours are within normal limits. New linear opacity is seen in both lung bases, which may be due to atelectasis or scarring. No evidence of pulmonary consolidation or edema. No evidence of pneumothorax or pleural effusion. IMPRESSION: Bibasilar atelectasis versus scarring, new since prior exam in 2012. Electronically Signed   By: Myles Rosenthal M.D.   On: 10/27/2014 09:37   Ct Abdomen Pelvis W Contrast  10/27/2014  CLINICAL DATA:  Epigastric pain which began yesterday. Nausea this morning. EXAM: CT ABDOMEN AND PELVIS WITH CONTRAST TECHNIQUE: Multidetector CT imaging of the abdomen and pelvis was performed using the standard protocol following bolus administration of intravenous contrast. CONTRAST:   OMNIPAQUE IOHEXOL 300 MG/ML  SOLN COMPARISON:  Report of the CT of 03/25/1998. FINDINGS: Lower chest: Bibasilar atelectasis. Normal heart size without pericardial effusion. Trace bilateral pleural fluid or thickening. Hepatobiliary: Normal liver. Cholecystectomy, without biliary ductal dilatation. Pancreas: Normal, without mass or ductal dilatation. Spleen: Normal in size, without focal abnormality. Adrenals/Urinary Tract: Normal adrenal glands. Normal kidneys, without hydronephrosis. Normal urinary bladder. Stomach/Bowel: Normal stomach, without wall thickening. Normal colon and terminal ileum. Normal appendix, including on image/series 51/201. Normal small bowel. Vascular/Lymphatic: Aortic atherosclerosis. Retroaortic left renal vein. No abdominopelvic adenopathy. Reproductive: Normal uterus and adnexa. Other: No significant free fluid. Musculoskeletal: Disc bulges, including at L4-5. IMPRESSION: 1.  No acute process in the abdomen or pelvis. 2. Trace bilateral pleural fluid or thickening, favored to be physiologic. 3. Normal appendix. 4. Cholecystectomy. Electronically Signed   By: Jeronimo Greaves M.D.   On: 10/27/2014 14:40   I have personally reviewed and evaluated these images and lab results as part of my medical decision-making.   EKG Interpretation None      MDM   Final diagnoses:  Shortness of breath  Chest pain, non-cardiac    Patient presents with gradual worsening shortness of breath x 1 day who experienced substernal non-radiating chest pain this morning.  CXR pending.  BMP, troponin, CBC, BNP, and EKG pending.  EKG shows sinus tachycardia with no acute changes. Troponin negative.  BMP and CBC unremarkable.  CXR shows bibasilar atelectasis.  No PTX or pleural effusion.  Will give incentive spirometry.  Doubt PE.  Patient remains hypotensive, will continue fluids.  Will obtain lipase, hepatic function, and lactic acid.  Will obtain CT abdomen and pelvis.  Will give protonix.    Lactic acid 1.06, lipase and hepatic function panel unremarkable.  CT abdomen/pelvic pending.   CT abdomen/pelvis shows no acute processes in the abdomen of pelvis.  Doubt viscous perforation.  Patient stable for discharge.  Discussed return precautions.  Patient follow up with PCP.  Patient agrees and acknowledge the above plan for discharge. Blood pressure 100/63, pulse 74, temperature 99.1 F (37.3 C), temperature source Oral, resp. rate 21, SpO2 96 %.  Patient stable for discharge.  Discussed return precautions.  Patient follow up with PCP on Monday.  Patient agrees and acknowledges the above plan for discharge.  Case has been discussed with and seen by Dr. Donnald Garre who agrees with the above plan for discharge.  Cheri FowlerKayla Kyley Laurel, PA-C 10/27/14 1558  Arby BarretteMarcy Pfeiffer, MD 11/03/14 [phone]

## 2014-10-27 NOTE — ED Notes (Signed)
Pt arrives via [facility] from home with gradual onset of SOB that began last night. Today developed substernal chest pain so stopped at a firestation and called EMS. Pt received 324mg  ASA, 0.4mg  nitro SL with pain resolution from 3/10 to 0/10. 20g lhand. Denies pain at present, alert, oriented x4, ambulatory, VSS.

## 2014-10-27 NOTE — Discharge Instructions (Signed)

## 2014-10-31 ENCOUNTER — Encounter (HOSPITAL_COMMUNITY): Payer: Self-pay | Admitting: *Deleted

## 2014-10-31 ENCOUNTER — Emergency Department (HOSPITAL_COMMUNITY): Payer: BC Managed Care – PPO

## 2014-10-31 ENCOUNTER — Emergency Department (HOSPITAL_COMMUNITY)
Admission: EM | Admit: 2014-10-31 | Discharge: 2014-11-01 | Disposition: A | Discharge status: Home / Self Care | Payer: BC Managed Care – PPO | Attending: Emergency Medicine | Admitting: Emergency Medicine

## 2014-10-31 DIAGNOSIS — I10 Essential (primary) hypertension: Secondary | ICD-10-CM | POA: Diagnosis not present

## 2014-10-31 DIAGNOSIS — F419 Anxiety disorder, unspecified: Secondary | ICD-10-CM | POA: Insufficient documentation

## 2014-10-31 DIAGNOSIS — Z9013 Acquired absence of bilateral breasts and nipples: Secondary | ICD-10-CM | POA: Diagnosis not present

## 2014-10-31 DIAGNOSIS — R079 Chest pain, unspecified: Secondary | ICD-10-CM | POA: Diagnosis present

## 2014-10-31 DIAGNOSIS — Z79899 Other long term (current) drug therapy: Secondary | ICD-10-CM | POA: Insufficient documentation

## 2014-10-31 DIAGNOSIS — R0789 Other chest pain: Secondary | ICD-10-CM | POA: Diagnosis not present

## 2014-10-31 DIAGNOSIS — E559 Vitamin D deficiency, unspecified: Secondary | ICD-10-CM | POA: Diagnosis not present

## 2014-10-31 DIAGNOSIS — Z8709 Personal history of other diseases of the respiratory system: Secondary | ICD-10-CM | POA: Insufficient documentation

## 2014-10-31 DIAGNOSIS — E079 Disorder of thyroid, unspecified: Secondary | ICD-10-CM | POA: Diagnosis not present

## 2014-10-31 DIAGNOSIS — Z8739 Personal history of other diseases of the musculoskeletal system and connective tissue: Secondary | ICD-10-CM | POA: Insufficient documentation

## 2014-10-31 DIAGNOSIS — G4700 Insomnia, unspecified: Secondary | ICD-10-CM | POA: Insufficient documentation

## 2014-10-31 LAB — CBC WITH DIFFERENTIAL/PLATELET
Basophils Absolute: 0 10*3/uL (ref 0.0–0.1)
Basophils Relative: 0 %
Eosinophils Absolute: 0 10*3/uL (ref 0.0–0.7)
Eosinophils Relative: 1 %
HCT: 39.3 % (ref 36.0–46.0)
Hemoglobin: 13.6 g/dL (ref 12.0–15.0)
Lymphocytes Relative: 32 %
Lymphs Abs: 1.4 10*3/uL (ref 0.7–4.0)
MCH: 30 pg (ref 26.0–34.0)
MCHC: 34.6 g/dL (ref 30.0–36.0)
MCV: 86.6 fL (ref 78.0–100.0)
Monocytes Absolute: 0.4 10*3/uL (ref 0.1–1.0)
Monocytes Relative: 10 %
Neutro Abs: 2.5 10*3/uL (ref 1.7–7.7)
Neutrophils Relative %: 57 %
Platelets: 347 10*3/uL (ref 150–400)
RBC: 4.54 MIL/uL (ref 3.87–5.11)
RDW: 13.5 % (ref 11.5–15.5)
WBC: 4.3 10*3/uL (ref 4.0–10.5)

## 2014-10-31 LAB — BASIC METABOLIC PANEL
Anion gap: 9 (ref 5–15)
BUN: 13 mg/dL (ref 6–20)
CO2: 26 mmol/L (ref 22–32)
Calcium: 9.8 mg/dL (ref 8.9–10.3)
Chloride: 99 mmol/L — ABNORMAL LOW (ref 101–111)
Creatinine, Ser: 0.71 mg/dL (ref 0.44–1.00)
GFR calc Af Amer: >60 mL/min (ref >60)
GFR calc non Af Amer: >60 mL/min (ref >60)
Glucose, Bld: 91 mg/dL (ref 65–99)
Potassium: 3 mmol/L — ABNORMAL LOW (ref 3.5–5.1)
Sodium: 134 mmol/L — ABNORMAL LOW (ref 135–145)

## 2014-10-31 MED ORDER — IOHEXOL 350 MG/ML SOLN
100.0000 mL | Freq: Once | INTRAVENOUS | Status: AC | PRN
  Administered 2014-10-31: 100 mL via INTRAVENOUS

## 2014-10-31 MED ORDER — ALBUTEROL SULFATE (2.5 MG/3ML) 0.083% IN NEBU
5.0000 mg | INHALATION_SOLUTION | Freq: Once | RESPIRATORY_TRACT | Status: AC
  Administered 2014-10-31: 5 mg via RESPIRATORY_TRACT
  Filled 2014-10-31: qty 6

## 2014-10-31 MED ORDER — HYDROCODONE-ACETAMINOPHEN 5-325 MG PO TABS: 1.0000 | ORAL_TABLET | Freq: Four times a day (QID) | ORAL | Status: DC | PRN

## 2014-10-31 MED ORDER — IBUPROFEN 800 MG PO TABS: 800.0000 mg | ORAL_TABLET | Freq: Three times a day (TID) | ORAL | Status: DC | PRN

## 2014-10-31 NOTE — ED Provider Notes (Signed)
CSN: [id number]     Arrival date & time 10/31/14  1932 History   First MD Initiated Contact with Patient 10/31/14 2046     Chief Complaint  Patient presents with  . Chest Pain     (Consider location/radiation/quality/duration/timing/severity/associated sxs/prior Treatment) HPI  Jill Mcintosh is a 54 yo Caucasian female with history of lupus, hypertension presenting to the Emergency Department for evaluation of left-sided substernal chest pain. Pt is accompanied by her husband. She states she was seen at [facility] on 10/20 for SOB and CP and she has not felt better since being discharged. She saw her PCP today and had elevated D-Dimer (0.88). Pt was advised by her PCP to be evaluated at the ED. She describes pain as "stabbing" and worse with deep inspirations. Endorses headaches and shortness of breath when laying supine. She reports she has been sleeping upright on her couch for the last five nights. Denies dizziness, lightheadedness, syncope, palpitations, nausea, vomiting, fever. Pt states she has had pleural effusions in the past and her symptoms currently feel similar.   Past Medical History  Diagnosis Date  . Lupus (HCC)   . Hypertension   . H/O bilateral breast reduction surgery   . Raynaud's syndrome   . Thyroid disease   . Insomnia   . Sjogren's disease (HCC)   . Bradycardia   . Sicca syndrome (HCC)   . Anxiety   . Vitamin D deficiency   . Mild tricuspid regurgitation   . Allergic rhinitis    Past Surgical History  Procedure Laterality Date  . Tonsillectomy    . Nasal sinus surgery    . Breast reduction surgery    . Cholecystectomy     Family History  Problem Relation Age of Onset  . Hyperlipidemia Mother   . Hypertension Mother   . Heart attack Father   . Hyperlipidemia Father   . Hypertension Father   . Hypertension Brother   . Diabetes Brother   . Heart disease Brother   . Hypertension Paternal Grandmother   . Diabetes Paternal Grandmother   . Heart attack  Paternal Grandfather   . Hypertension Brother    Social History  Substance Use Topics  . Smoking status: Never Smoker   . Smokeless tobacco: None  . Alcohol Use: 0.0 oz/week    0 Standard drinks or equivalent per week     Comment: rarely   OB History    No data available     Review of Systems  All other systems negative except as documented in the HPI. All pertinent positives and negatives as reviewed in the HPI.  Allergies  Darvon  Home Medications   Prior to Admission medications   Medication Sig Start Date End Date Taking? Authorizing Provider  acetaminophen (TYLENOL) 500 MG tablet Take 1 tablet (500 mg total) by mouth every 6 (six) hours as needed. 10/27/14   Cheri Fowler, PA-C  acetaminophen-codeine (TYLENOL #3) 300-30 MG per tablet Take 1 tablet by mouth as needed. For pain 05/26/14   Historical Provider, MD  ARTIFICIAL TEAR SOLUTION OP Apply 1 drop to eye daily as needed (dryness).    Historical Provider, MD  Calcium-Vitamin D 600-200 MG-UNIT per tablet Take 1 tablet by mouth daily.    Historical Provider, MD  cycloSPORINE (RESTASIS) 0.05 % ophthalmic emulsion 1 drop 2 (two) times daily.    Historical Provider, MD  Flaxseed, Linseed, (FLAX SEED OIL) 1000 MG CAPS Take 1 capsule by mouth daily.    Historical Provider, MD  folic acid (FOLVITE) 1 MG tablet Take 2 mg by mouth daily.  05/25/14   Historical Provider, MD  Glucosamine 500 MG CAPS Take 500 mg by mouth 3 (three) times daily.    Historical Provider, MD  hydroxypropyl methylcellulose / hypromellose (ISOPTO TEARS / GONIOVISC) 2.5 % ophthalmic solution 1 drop. Use as directed    Historical Provider, MD  ibuprofen (ADVIL,MOTRIN) 200 MG tablet Take 200 mg by mouth every 6 (six) hours as needed.    Historical Provider, MD  levothyroxine (SYNTHROID, LEVOTHROID) 137 MCG tablet Take 137 mcg by mouth daily before breakfast.    Historical Provider, MD  lisinopril-hydrochlorothiazide (PRINZIDE,ZESTORETIC) 10-12.5 MG per tablet Take 1  tablet by mouth daily.    Historical Provider, MD  meloxicam (MOBIC) 15 MG tablet Take 15 mg by mouth as needed. For inflammation 04/08/14   Historical Provider, MD  Methotrexate Sodium (METHOTREXATE, PF,) 50 MG/2ML injection Inject 0.05 mLs into the muscle once a week. 05/25/14   Historical Provider, MD  Multiple Vitamins-Minerals (CENTRUM ADULTS PO) Take 1 tablet by mouth daily.    Historical Provider, MD  Omega 3 1000 MG CAPS Take 1 capsule by mouth daily.    Historical Provider, MD  pantoprazole (PROTONIX) 20 MG tablet Take 1 tablet (20 mg total) by mouth daily. 10/27/14   Cheri FowlerKayla Rose, PA-C  phentermine 37.5 MG capsule Take 37.5 mg by mouth every morning.    Historical Provider, MD  polyethylene glycol powder (GLYCOLAX/MIRALAX) powder Take 1 Container by mouth as needed. For constipation 03/15/14   Historical Provider, MD  pravastatin (PRAVACHOL) 20 MG tablet Take 20 mg by mouth daily.    Historical Provider, MD  sodium chloride (MURO 128) 5 % ophthalmic solution Place 1 drop into both eyes 2 (two) times daily.    Historical Provider, MD  sodium chloride (OCEAN) 0.65 % SOLN nasal spray Place 1 spray into both nostrils as needed for congestion.    Historical Provider, MD  sucralfate (CARAFATE) 1 G tablet Take 1 tablet (1 g total) by mouth 4 (four) times daily. 10/27/14   Cheri FowlerKayla Rose, PA-C  traMADol (ULTRAM) 50 MG tablet Take 50 mg by mouth every 6 (six) hours as needed for moderate pain.    Historical Provider, MD  zolpidem (AMBIEN) 5 MG tablet Take 5 mg by mouth at bedtime as needed for sleep.    Historical Provider, MD   BP 151/81 mmHg  Pulse 86  Temp(Src) 98.5 F (36.9 C) (Oral)  Resp 20  SpO2 99% Physical Exam  Constitutional: She is oriented to person, place, and time. She appears well-developed and well-nourished.  54 yo female who appears anxious.  HENT:  Head: Normocephalic and atraumatic.  Cardiovascular: Normal rate, regular rhythm and normal heart sounds.  Exam reveals no gallop and  no friction rub.   No murmur heard.   Pulmonary/Chest: Effort normal and breath sounds normal. No respiratory distress. She has no wheezes. She has no rales. She exhibits no tenderness.  Abdominal: Soft. Bowel sounds are normal. She exhibits no distension. There is no tenderness.  Musculoskeletal: Normal range of motion. She exhibits no edema or tenderness.  Neurological: She is alert and oriented to person, place, and time.    ED Course  Procedures (including critical care time) Labs Review Labs Reviewed - No data to display  Imaging Review Dg Chest 2 View  10/31/2014  CLINICAL DATA:  54 year old female with shortness of breath chest pain since 10/27/2014. Left-sided chest pain today with shortness of breath and  dyspnea. EXAM: CHEST  2 VIEW COMPARISON:  Chest x-ray 10/27/2014. FINDINGS: Linear scarring throughout the lung bases bilaterally is unchanged. No acute consolidative airspace disease. No pleural effusions. No evidence of pulmonary edema. Heart size is normal. Upper mediastinal contours are within normal limits. No pneumothorax. Surgical clips project over the right upper quadrant the abdomen, compatible with prior cholecystectomy. IMPRESSION: 1. No radiographic evidence of acute cardiopulmonary disease. The appearance of the chest is unchanged, as above. Electronically Signed   By: Trudie Reed M.D.   On: 10/31/2014 20:14   I have personally reviewed and evaluated these images and lab results as part of my medical decision-making.   EKG Interpretation None     patient will be treated for chest wall type pain as the pain is worse with deep breathing.  The patient is advised to return here as needed.  Told to follow up with her primary care doctor   Charlestine Night, PA-C 11/01/14 0150  Gilda Crease, MD 11/02/14 [phone]

## 2014-10-31 NOTE — ED Notes (Signed)
RT called for breathing treatment.

## 2014-10-31 NOTE — ED Notes (Signed)
Pt states that [facility] for short[facility] of breath and chest pain; pt states that she was discharged and continued to have the symptoms; pt states that she saw her PCP today and had a elevated D-Dimer (pt reports 0.88) and states that the MD advised her to come to the ER to be evaluated for blood clot; pt states that she still has intermittent chest pain and shortness of breath; pt states that the chest hurts when she takes in a deep breathe

## 2014-10-31 NOTE — ED Notes (Signed)
Pt is in CT

## 2014-10-31 NOTE — Discharge Instructions (Signed)
Return here as needed.  Follow-up with your primary care doctor °

## 2015-07-12 ENCOUNTER — Ambulatory Visit
Admission: RE | Admit: 2015-07-12 | Discharge: 2015-07-12 | Disposition: A | Discharge status: Home / Self Care | Payer: BC Managed Care – PPO | Source: Ambulatory Visit | Attending: Physician Assistant | Admitting: Physician Assistant

## 2015-07-12 ENCOUNTER — Other Ambulatory Visit: Payer: Self-pay | Admitting: Physician Assistant

## 2015-07-12 DIAGNOSIS — R0781 Pleurodynia: Secondary | ICD-10-CM

## 2015-11-15 ENCOUNTER — Other Ambulatory Visit: Payer: Self-pay | Admitting: Family Medicine

## 2015-11-15 DIAGNOSIS — Z1231 Encounter for screening mammogram for malignant neoplasm of breast: Secondary | ICD-10-CM

## 2015-12-14 ENCOUNTER — Ambulatory Visit
Admission: RE | Admit: 2015-12-14 | Discharge: 2015-12-14 | Disposition: A | Discharge status: Home / Self Care | Payer: BC Managed Care – PPO | Source: Ambulatory Visit | Attending: Family Medicine | Admitting: Family Medicine

## 2015-12-14 DIAGNOSIS — Z1231 Encounter for screening mammogram for malignant neoplasm of breast: Secondary | ICD-10-CM

## 2017-03-20 ENCOUNTER — Other Ambulatory Visit: Payer: Self-pay | Admitting: Family Medicine

## 2017-03-20 ENCOUNTER — Other Ambulatory Visit (HOSPITAL_COMMUNITY)
Admission: RE | Admit: 2017-03-20 | Discharge: 2017-03-20 | Disposition: A | Discharge status: Home / Self Care | Payer: BC Managed Care – PPO | Source: Ambulatory Visit | Attending: Family Medicine | Admitting: Family Medicine

## 2017-03-20 DIAGNOSIS — Z124 Encounter for screening for malignant neoplasm of cervix: Secondary | ICD-10-CM | POA: Diagnosis present

## 2017-03-25 LAB — CYTOLOGY - PAP
Diagnosis: NEGATIVE
HPV: NOT DETECTED

## 2017-05-19 ENCOUNTER — Other Ambulatory Visit: Payer: Self-pay | Admitting: Family Medicine

## 2017-05-19 DIAGNOSIS — Z1231 Encounter for screening mammogram for malignant neoplasm of breast: Secondary | ICD-10-CM

## 2017-05-20 ENCOUNTER — Ambulatory Visit
Admission: RE | Admit: 2017-05-20 | Discharge: 2017-05-20 | Disposition: A | Discharge status: Home / Self Care | Payer: BC Managed Care – PPO | Source: Ambulatory Visit | Attending: Family Medicine | Admitting: Family Medicine

## 2017-05-20 DIAGNOSIS — Z1231 Encounter for screening mammogram for malignant neoplasm of breast: Secondary | ICD-10-CM

## 2018-08-08 HISTORY — PX: ANUS SURGERY: SHX302

## 2018-12-09 ENCOUNTER — Other Ambulatory Visit: Payer: Self-pay | Admitting: Family Medicine

## 2018-12-09 DIAGNOSIS — R200 Anesthesia of skin: Secondary | ICD-10-CM

## 2018-12-29 ENCOUNTER — Ambulatory Visit: Payer: BC Managed Care – PPO | Admitting: Neurology

## 2018-12-29 ENCOUNTER — Other Ambulatory Visit: Payer: Self-pay

## 2018-12-29 ENCOUNTER — Encounter: Payer: Self-pay | Admitting: Neurology

## 2018-12-29 VITALS — BP 145/87 | HR 67 | Temp 97.4°F | Ht 61.0 in | Wt 184.0 lb

## 2018-12-29 DIAGNOSIS — Z9889 Other specified postprocedural states: Secondary | ICD-10-CM

## 2018-12-29 DIAGNOSIS — R200 Anesthesia of skin: Secondary | ICD-10-CM | POA: Diagnosis not present

## 2018-12-29 DIAGNOSIS — M329 Systemic lupus erythematosus, unspecified: Secondary | ICD-10-CM

## 2018-12-29 DIAGNOSIS — G2581 Restless legs syndrome: Secondary | ICD-10-CM

## 2018-12-29 NOTE — Progress Notes (Signed)
Subjective:    Patient ID: Jill Mcintosh is a 58 y.o. female.  HPI    Star Age, MD, PhD [facility] [address] [place], [place] [address]   Dear Dr. Tamala Julian, I saw your patient, Jill Mcintosh, upon your kind request in my neurologic clinic today for initial consultation of her left arm numbness.  The patient is unaccompanied today.  As you know, Ms. Jill Mcintosh is a 58 year old right-handed woman with an underlying medical history of vitamin D deficiency, thyroid disease, Sjogren's syndrome, Raynaud's syndrome, lupus, hypertension, bradycardia, allergic rhinitis, anxiety, and obesity, who reports an approximately 6-week history of left hand numbness.  She feels that symptoms came on fairly suddenly.  At the same time she had developed some left jaw pain, not so much numbness and eventually saw her dentist and was told that she had a cracked crown.  She had some dental work done on the right and the left and the crown was fixed.  Her jaw pain has resolved.  She denies any obvious weakness, she has a history of carpal tunnel syndrome on the right and had carpal tunnel surgery on the right in the 90s, wore splints for weeks or months before the surgery, symptoms on the left hand field different from her previous carpal tunnel experience on the right.  She had a short course of prednisone through your office, she took 20 mg twice daily for 10 days, no obvious telltale changes, no improvement with a trial of a muscle relaxer recently.  Denies any radiating neck pain, had a little bit of neck stiffness recently but no telltale radiating pain, no elbow pain, no forearm numbness, really mostly the left hand, entire hand and palm seems numb, not painful, has not had any grip weakness, no slurring of speech, no recurrent headaches.  She follows with Dr. Lenna Gilford for her lupus and has been on Plaquenil, no recent use of methotrexate.  I reviewed your office note  from 11/24/2018, which you kindly included.  You ordered a neck MRI.  She has not had it yet.  She reports that her insurance denied it.  She had blood work through your office on 11/03/2018.  She reports that she has restless leg syndrome, Mirapex has been helpful, she currently takes 1 mg each night, when she recently had a gap in treatment because she ran out, after 3 days she had a severe flareup in her restless legs.  She had iron studies through your office on 11/03/2018 which showed a low ferritin of 9.5.  She has not been taking an iron supplements she admits, apparently she was advised to take 1 through a portal message but she is not on an iron supplement, hemoglobin was 11.8, hematocrit 35, both mildly low, total iron binding capacity mildly elevated at 480, iron saturation low at 12.  CMP showed BUN of 11, creatinine of 0.81, total protein borderline elevated at 8.5, otherwise normal liver function.  She is married and lives with her husband, their 71 year old daughter currently lives with them, she is their only child.  The patient has been a non-smoker and drinks alcohol very occasionally, caffeine and limitation in the form of coffee, 1 cup in the morning daily. 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  Her Past Medical History Is Significant For: Past Medical History:  Diagnosis  Date  . Allergic rhinitis   . Anxiety   . Bradycardia   . H/O bilateral breast reduction surgery   . Hypertension   . Insomnia   . Lupus (HCC)   . Mild tricuspid regurgitation   . Raynaud's syndrome   . Sicca syndrome (HCC)   . Sjogren's disease (HCC)   . Thyroid disease   . Vitamin D deficiency     Her Past Surgical History Is Significant For: Past Surgical History:  Procedure Laterality Date  . BREAST REDUCTION SURGERY    . CHOLECYSTECTOMY    . NASAL  SINUS SURGERY    . REDUCTION MAMMAPLASTY Bilateral   . TONSILLECTOMY      Her Family History Is Significant For: Family History  Problem Relation Age of Onset  . Hyperlipidemia Mother   . Hypertension Mother   . Heart attack Father   . Hyperlipidemia Father   . Hypertension Father   . Hypertension Brother   . Diabetes Brother   . Heart disease Brother   . Hypertension Paternal Grandmother   . Diabetes Paternal Grandmother   . Heart attack Paternal Grandfather   . Hypertension Brother     Her Social History Is Significant For: Social History   Socioeconomic History  . Marital status: Married    Spouse name: jerry  . Number of children: 1  . Years of education: college  . Highest education level: Not on file  Occupational History  . Occupation: [employer]  Tobacco Use  . Smoking status: Never Smoker  . Smokeless tobacco: Never Used  Substance and Sexual Activity  . Alcohol use: Yes    Alcohol/week: 0.0 standard drinks    Comment: rarely  . Drug use: No  . Sexual activity: Not on file  Other Topics Concern  . Not on file  Social History Narrative  . Not on file   Social Determinants of Health   Financial Resource Strain:   . Difficulty of Paying Living Expenses: Not on file  Food Insecurity:   . Worried About Programme researcher, broadcasting/film/video in the Last Year: Not on file  . Ran Out of Food in the Last Year: Not on file  Transportation Needs:   . Lack of Transportation (Medical): Not on file  . Lack of Transportation (Non-Medical): Not on file  Physical Activity:   . Days of Exercise per Week: Not on file  . Minutes of Exercise per Session: Not on file  Stress:   . Feeling of Stress : Not on file  Social Connections:   . Frequency of Communication with Friends and Family: Not on file  . Frequency of Social Gatherings with Friends and Family: Not on file  . Attends Religious Services: Not on file  . Active Member of Clubs or Organizations: Not on file  .  Attends Banker Meetings: Not on file  . Marital Status: Not on file    Her Allergies Are:  Allergies  Allergen Reactions  . Darvon [Propoxyphene] Other (See Comments)    Headache and hullicination  :   Her Current Medications Are:  Outpatient Encounter Medications as of 12/29/2018  Medication Sig  . hydroxychloroquine (PLAQUENIL) 200 MG tablet Take by mouth.  . levothyroxine (SYNTHROID) 112 MCG tablet Take 112 mcg by mouth daily before breakfast.  . linaclotide (LINZESS) 145 MCG CAPS capsule Take by mouth.  Marland Kitchen lisinopril-hydrochlorothiazide (PRINZIDE,ZESTORETIC) 10-12.5 MG per tablet Take 1 tablet by mouth daily.  . pramipexole (MIRAPEX) 1 MG tablet Take  1 mg by mouth at bedtime.  . pravastatin (PRAVACHOL) 20 MG tablet Take 20 mg by mouth daily.  . [DISCONTINUED] acetaminophen (TYLENOL) 500 MG tablet Take 1 tablet (500 mg total) by mouth every 6 (six) hours as needed. (Patient taking differently: Take 500 mg by mouth every 6 (six) hours as needed for mild pain. )  . [DISCONTINUED] acetaminophen-codeine (TYLENOL #3) 300-30 MG per tablet Take 1 tablet by mouth as needed. For pain  . [DISCONTINUED] ARTIFICIAL TEAR SOLUTION OP Apply 1 drop to eye daily as needed (dryness).  . [DISCONTINUED] Calcium-Vitamin D 600-200 MG-UNIT per tablet Take 1 tablet by mouth daily.  . [DISCONTINUED] cycloSPORINE (RESTASIS) 0.05 % ophthalmic emulsion 1 drop 2 (two) times daily.  . [DISCONTINUED] Flaxseed, Linseed, (FLAX SEED OIL) 1000 MG CAPS Take 1 capsule by mouth daily.  . [DISCONTINUED] folic acid (FOLVITE) 1 MG tablet Take 2 mg by mouth daily.   . [DISCONTINUED] HYDROcodone-acetaminophen (NORCO/VICODIN) 5-325 MG tablet Take 1 tablet by mouth every 6 (six) hours as needed for moderate pain.  . [DISCONTINUED] ibuprofen (ADVIL,MOTRIN) 800 MG tablet Take 1 tablet (800 mg total) by mouth every 8 (eight) hours as needed.  . [DISCONTINUED] levothyroxine (SYNTHROID) 112 MCG tablet Take 112 mcg by  mouth daily.  . [DISCONTINUED] levothyroxine (SYNTHROID, LEVOTHROID) 137 MCG tablet Take 137 mcg by mouth daily before breakfast.  . [DISCONTINUED] meloxicam (MOBIC) 15 MG tablet Take 15 mg by mouth as needed for pain. For inflammation  . [DISCONTINUED] Methotrexate Sodium (METHOTREXATE, PF,) 50 MG/2ML injection Inject 0.05 mLs into the muscle once a week.  . [DISCONTINUED] Multiple Vitamins-Minerals (CENTRUM ADULTS PO) Take 1 tablet by mouth daily.  . [DISCONTINUED] naproxen sodium (ANAPROX) 220 MG tablet Take 220 mg by mouth 2 (two) times daily as needed (pain).  . [DISCONTINUED] Omega 3 1000 MG CAPS Take 1 capsule by mouth daily.  . [DISCONTINUED] pantoprazole (PROTONIX) 20 MG tablet Take 1 tablet (20 mg total) by mouth daily.  . [DISCONTINUED] phentermine 37.5 MG capsule Take 37.5 mg by mouth every morning.  . [DISCONTINUED] polyethylene glycol powder (GLYCOLAX/MIRALAX) powder Take 17 g by mouth as needed for mild constipation. For constipation  . [DISCONTINUED] sodium chloride (MURO 128) 5 % ophthalmic solution Place 1 drop into both eyes 2 (two) times daily.  . [DISCONTINUED] sodium chloride (OCEAN) 0.65 % SOLN nasal spray Place 1 spray into both nostrils as needed for congestion.  . [DISCONTINUED] sucralfate (CARAFATE) 1 G tablet Take 1 tablet (1 g total) by mouth 4 (four) times daily.  . [DISCONTINUED] traMADol (ULTRAM) 50 MG tablet Take 50 mg by mouth every 6 (six) hours as needed for moderate pain.   No facility-administered encounter medications on file as of 12/29/2018.  :   Review of Systems:  Out of a complete 14 point review of systems, all are reviewed and negative with the exception of these symptoms as listed below:  Review of Systems  Neurological:       Reports left hand numbness over the last month. Denies any triggers that make the sx worse. She does wear a brace in the evenings but does not notice any benefit.  sts RLS started within the last year. Reports sx are equal  in both legs. Currently taking Mirapex med controls sx pretty well.     Objective:  Neurological Exam  Physical Exam Physical Examination:   Vitals:   12/29/18 1443  BP: (!) 145/87  Pulse: 67  Temp: (!) 97.4 F (36.3 C)  General Examination: The patient is a very pleasant 58 y.o. female in no acute distress. She appears well-developed and well-nourished and well groomed.   HEENT: Normocephalic, atraumatic, pupils are equal, round and reactive to light and accommodation. Extraocular tracking is good without limitation to gaze excursion or nystagmus noted. Normal smooth pursuit is noted. Hearing is grossly intact. Face is symmetric with normal facial animation and normal facial sensation, With the exception of mild decrease in pinprick sensation in the left lower face around the jawline. Speech is clear with no dysarthria noted. There is no hypophonia. There is no lip, neck/head, jaw or voice tremor. Neck is supple with full range of passive and active motion. There are no carotid bruits on auscultation. Oropharynx exam reveals: moderate mouth dryness, adequate dental hygiene. Tongue protrudes centrally and palate elevates symmetrically.   Chest: Clear to auscultation without wheezing, rhonchi or crackles noted.  Heart: S1+S2+0, regular and normal without murmurs, rubs or gallops noted.   Abdomen: Soft, non-tender and non-distended with normal bowel sounds appreciated on auscultation.  Extremities: There is no pitting edema in the distal lower extremities bilaterally. Pedal pulses are intact.  Skin: Warm and dry without trophic changes noted.  Musculoskeletal: exam reveals no obvious joint deformities, tenderness or joint swelling or erythema.   Neurologically:  Mental status: The patient is awake, alert and oriented in all 4 spheres. Her immediate and remote memory, attention, language skills and fund of knowledge are appropriate. There is no evidence of aphasia, agnosia, apraxia  or anomia. Speech is clear with normal prosody and enunciation. Thought process is linear. Mood is normal and affect is normal.  Cranial nerves II - XII are as described above under HEENT exam. In addition: shoulder shrug is normal with equal shoulder height noted. Motor exam: Normal bulk, strength and tone is noted.She seems to have a slightly reduced grip in both hands equally, 4+ out of 5, unremarkable scar from right carpal tunnel surgery, negative Tinel's bilaterally, negative Phalen's bilaterally. There is no drift, tremor or rebound. Romberg is negative. Reflexes are 2+ throughout. Babinski: Toes are flexor bilaterally. Fine motor skills and coordination: intact with normal finger taps, normal hand movements, normal rapid alternating patting, normal foot taps and normal foot agility.  Cerebellar testing: No dysmetria or intention tremor on finger to nose testing. Heel to shin is unremarkable bilaterally. There is no truncal or gait ataxia.  Sensory exam: intact to light touch, pinprick, vibration, temperature sense in the RUE and RLE, Decreased sensation to pinprick and temperature in the left hand, particularly the fingertips, slight decrease in pinprick sensation in the medial aspect of the distal lower leg, otherwise preserved sensation to light touch, pinprick, temperature and vibration in the left leg.  Gait, station and balance: She stands easily. No veering to one side is noted. No leaning to one side is noted. Posture is age-appropriate and stance is narrow based. Gait shows normal stride length and normal pace. No problems turning are noted. Tandem walk is unremarkable.     Assessment and plan:    In summary, Vicente MassonBarbara H Eshbach is a very pleasant 58 y.o.-year old female with an underlying medical history of vitamin D deficiency, thyroid disease, Sjogren's syndrome, Raynaud's syndrome, lupus, hypertension, bradycardia, allergic rhinitis, anxiety, and obesity, who Presents for evaluation of  her left hand numbness which started about 6 weeks ago, first week of November, it is essentially unchanged and started fairly abruptly as she recalls.  Interestingly, she had some jaw pain at the  time which has since then resolved but on examination she does have decreased sensation in the left hand, no telltale history or findings supportive of carpal tunnel syndrome, also slight decrease in pinprick sensation in the left leg.  Given her complex medical history and autoimmune history, In particular, her diagnosis of lupus, we should look at a central cause of her symptoms, including a stroke.  Thankfully, she does not have any obvious other symptoms or findings on examination.  Given that she has a history of carpal tunnel syndrome on the right, I would like to exclude a peripheral cause as well, the history and examination are not clear-cut in her case.  I would like to proceed with a brain MRI with and without contrast as well as a EMG and nerve conduction velocity test.  She also reports a history of Restless leg syndrome and while her symptoms are well controlled on Mirapex 1 mg each night, she may benefit from iron supplementation, her goal is to have her ferritin level above 50 I explained to her and she had a ferritin level in October of 9.5. She is also mildly anemic.  She is encouraged to start an iron supplement over-the-counter.  I suggested follow-up after testing, we will keep her posted as to her brain MRI results and EMG and nerve conduction velocity test.  I answered all her questions today and she was in agreement. Thank you very much for allowing me to participate in the care of this nice patient. If I can be of any further assistance to you please do not hesitate to call me at [phone].  Sincerely,   Huston Foley, MD, PhD

## 2018-12-29 NOTE — Patient Instructions (Signed)
I would like to investigate your left hand numbness a little more, you do have a slight degree of numbness in the left lower face and in the left leg as well, I would like to proceed with a brain MRI with and without contrast.  In addition, I would like to rule out left-sided carpal tunnel as you have a history of carpal tunnel on the right.  We will schedule an EMG and nerve conduction velocity test in our office.  We will keep you posted as to your test results by phone call and take it from there.  Please Consider starting an iron supplement, as your storage iron which is called ferritin was low and can be a contributor to restless leg symptoms. You can start an over-the-counter iron supplement, either ferrous sulfate or ferrous gluconate. Not everybody tolerates iron very well and it can cause constipation or diarrhea. It is better to take it with a little bit of orange juice because the vitamin C helps with absorption. You can follow the directions on the medication bottle or box. You are mildly anemic.

## 2019-01-06 ENCOUNTER — Ambulatory Visit: Payer: BC Managed Care – PPO

## 2019-01-06 ENCOUNTER — Other Ambulatory Visit: Payer: Self-pay

## 2019-01-06 DIAGNOSIS — Z9889 Other specified postprocedural states: Secondary | ICD-10-CM | POA: Diagnosis not present

## 2019-01-06 DIAGNOSIS — R200 Anesthesia of skin: Secondary | ICD-10-CM

## 2019-01-06 DIAGNOSIS — G2581 Restless legs syndrome: Secondary | ICD-10-CM | POA: Diagnosis not present

## 2019-01-06 DIAGNOSIS — M329 Systemic lupus erythematosus, unspecified: Secondary | ICD-10-CM

## 2019-01-06 MED ORDER — GADOBENATE DIMEGLUMINE 529 MG/ML IV SOLN
15.0000 mL | Freq: Once | INTRAVENOUS | Status: AC | PRN
  Administered 2019-01-06: 15 mL via INTRAVENOUS

## 2019-01-11 NOTE — Progress Notes (Signed)
MRI brain showed no Acute findings, no abnormal contrast uptake, no explanation for her left-sided numbness.  Please update patient.

## 2019-01-12 ENCOUNTER — Other Ambulatory Visit: Payer: Self-pay

## 2019-02-04 ENCOUNTER — Encounter: Payer: Self-pay | Admitting: Orthopaedic Surgery

## 2019-02-04 ENCOUNTER — Other Ambulatory Visit: Payer: Self-pay

## 2019-02-04 ENCOUNTER — Ambulatory Visit: Payer: BC Managed Care – PPO | Admitting: Orthopaedic Surgery

## 2019-02-04 ENCOUNTER — Ambulatory Visit: Payer: Self-pay

## 2019-02-04 VITALS — Ht 61.0 in | Wt 179.0 lb

## 2019-02-04 DIAGNOSIS — M25512 Pain in left shoulder: Secondary | ICD-10-CM

## 2019-02-04 DIAGNOSIS — G8929 Other chronic pain: Secondary | ICD-10-CM | POA: Diagnosis not present

## 2019-02-04 NOTE — Progress Notes (Signed)
Office Visit Note   Patient: Jill Mcintosh           Date of Birth: 10-07-1960           MRN: [id number] Visit Date: 02/04/2019              Requested by: Jill Brunette, MD [phone] [place],  [place] [address] PCP: Jill Brunette, MD   Assessment & Plan: Visit Diagnoses:  1. Chronic left shoulder pain   2. Acute pain of left shoulder     Plan: Mild impingement syndrome left shoulder.  Possibly small rotator cuff tear.  Long discussion regarding diagnostic possibilities.  Jill Mcintosh would like to pursue noninvasive course of treatment initially and will try a course of physical therapy.  Consider subacromial cortisone injection next and possibly MRI scan  Follow-Up Instructions: Return if symptoms worsen or fail to improve.   Orders:  Orders Placed This Encounter  Procedures  . XR Shoulder Left  . Ambulatory referral to Physical Therapy   No orders of the defined types were placed in this encounter.     Procedures: No procedures performed   Clinical Data: No additional findings.   Subjective: Chief Complaint  Patient presents with  . Left Shoulder - Pain  Patient presents today for left shoulder pain. She fell in December and landed on her left side. She tried to doctor it herself but then went to [place] about a week ago and had x-rays. She states that she has some pain in her shoulder, but also in her arm. She has had numbness in her left hand since October, before the fall. She does state that her arm feels weaker than the other side. Her pain is worse at night. She is right hand dominant. She has been taking Tylenol with Codeine at night and states that it does not help much. Has a history of lupus and has been taking Plaquenil on a daily basis and supplements that with meloxicam as needed.  Also has Tylenol 3 at home if necessary. Does have some trouble sleeping on that side.  Has had some minor discomfort for several years but exacerbation  when she fell in December HPI  Review of Systems   Objective: Vital Signs: Ht 5\' 1"  (1.549 m)   Wt 179 lb (81.2 kg)   BMI 33.82 kg/m   Physical Exam Constitutional:      Appearance: She is well-developed.  Eyes:     Pupils: Pupils are equal, round, and reactive to light.  Pulmonary:     Effort: Pulmonary effort is normal.  Skin:    General: Skin is warm and dry.  Neurological:     Mental Status: She is alert and oriented to person, place, and time.  Psychiatric:        Behavior: Behavior normal.     Ortho Exam left shoulder exam was relatively benign with minimal impingement symptoms.  Negative empty can.  Good strength.  Biceps intact no significant tenderness along the glenohumeral joint or in the anterior or lateral subacromial area.  No pain at the [facility] joint.  No grating or crepitation.  Good grip and good release  Specialty Comments:  No specialty comments available.  Imaging: XR Shoulder Left  Result Date: 02/04/2019 Films of the left shoulder obtained in several projections.  No evidence of acute injury or fracture.  No ectopic calcification.  Normal space between the humeral head and the acromion.  Humeral head is centered about  the glenoid.  No evidence of glenohumeral arthritis.  Mild degenerative changes at the [facility] joint    PMFS History: Patient Active Problem List   Diagnosis Date Noted  . Pain in left shoulder 02/04/2019  . Bradycardia 06/07/2014  . Benign essential HTN 06/07/2014  . Lupus (systemic lupus erythematosus) ([place]) 06/07/2014   Past Medical History:  Diagnosis Date  . Allergic rhinitis   . Anxiety   . Bradycardia   . H/O bilateral breast reduction surgery   . Hypertension   . Insomnia   . Lupus ([place])   . Mild tricuspid regurgitation   . Raynaud's syndrome   . Sicca syndrome ([place])   . Sjogren's disease ([place])   . Thyroid disease   . Vitamin D deficiency     Family History  Problem Relation Age of Onset  . Hyperlipidemia Mother   .  Hypertension Mother   . Heart attack Father   . Hyperlipidemia Father   . Hypertension Father   . Hypertension Brother   . Diabetes Brother   . Heart disease Brother   . Hypertension Paternal Grandmother   . Diabetes Paternal Grandmother   . Heart attack Paternal Grandfather   . Hypertension Brother     Past Surgical History:  Procedure Laterality Date  . BREAST REDUCTION SURGERY    . CHOLECYSTECTOMY    . NASAL SINUS SURGERY    . REDUCTION MAMMAPLASTY Bilateral   . TONSILLECTOMY     Social History   Occupational History  . Occupation: [employer]  Tobacco Use  . Smoking status: Never Smoker  . Smokeless tobacco: Never Used  Substance and Sexual Activity  . Alcohol use: Yes    Alcohol/week: 0.0 standard drinks    Comment: rarely  . Drug use: No  . Sexual activity: Not on file

## 2019-02-08 ENCOUNTER — Ambulatory Visit (INDEPENDENT_AMBULATORY_CARE_PROVIDER_SITE_OTHER): Payer: BC Managed Care – PPO | Admitting: Physical Therapy

## 2019-02-08 ENCOUNTER — Encounter: Payer: Self-pay | Admitting: Physical Therapy

## 2019-02-08 ENCOUNTER — Other Ambulatory Visit: Payer: Self-pay

## 2019-02-08 DIAGNOSIS — M542 Cervicalgia: Secondary | ICD-10-CM | POA: Diagnosis not present

## 2019-02-08 DIAGNOSIS — M25512 Pain in left shoulder: Secondary | ICD-10-CM

## 2019-02-08 DIAGNOSIS — G8929 Other chronic pain: Secondary | ICD-10-CM | POA: Diagnosis not present

## 2019-02-08 DIAGNOSIS — M6281 Muscle weakness (generalized): Secondary | ICD-10-CM

## 2019-02-08 NOTE — Patient Instructions (Addendum)
Access Code: [email or web address]  URL: [email or web address]  Date: 02/08/2019  Prepared by: Ivery Quale   Exercises  Neck Retraction - 10 reps - 3 sets - 2x daily - 3-4x weekly  Standing Shoulder Posterior Capsule Stretch - 3 sets -hold 10-20 sec 2x daily - 3-4x weekly  Doorway Pec Stretch at 90 Degrees Abduction  3 sets hold 30 sec - 2x daily - 3-4x weekly  Standing Shoulder Internal Rotation Stretch with Towel - 10 reps - 1 sets - 10 sec hold - 2x daily - 3-4x weekly  Standing Shoulder Row with Anchored Resistance - 10 reps - 3 sets - 2x daily - 6x weekly  Shoulder Extension with Resistance - Neutral - 10 reps - 3 sets - 2x daily - 6x weekly  Shoulder External Rotation with Anchored Resistance - 10 reps - 3 sets - 2x daily - 6x weekly  Shoulder Internal Rotation with Resistance - 10 reps - 3 sets - 2x daily - 6x weekly

## 2019-02-08 NOTE — Therapy (Signed)
[facility] [address] [place], [place], [address] Phone: [phone]   Fax:  [phone]  Physical Therapy Evaluation  Patient Details  Name: Jill Mcintosh MRN: [id number] Date of Birth: 03-19-1960 Referring Provider (PT): Garald Balding, MD   Encounter Date: 02/08/2019  PT End of Session - 02/08/19 1519    Visit Number  1    Number of Visits  12    Date for PT Re-Evaluation  03/22/19    Authorization Type  BCBS    PT Start Time  1400    PT Stop Time  1445    PT Time Calculation (min)  45 min    Activity Tolerance  Patient tolerated treatment well    Behavior During Therapy  [facility] for tasks assessed/performed       Past Medical History:  Diagnosis Date  . Allergic rhinitis   . Anxiety   . Bradycardia   . H/O bilateral breast reduction surgery   . Hypertension   . Insomnia   . Lupus ([place])   . Mild tricuspid regurgitation   . Raynaud's syndrome   . Sicca syndrome ([place])   . Sjogren's disease ([place])   . Thyroid disease   . Vitamin D deficiency     Past Surgical History:  Procedure Laterality Date  . BREAST REDUCTION SURGERY    . CHOLECYSTECTOMY    . NASAL SINUS SURGERY    . REDUCTION MAMMAPLASTY Bilateral   . TONSILLECTOMY      There were no vitals filed for this visit.   Subjective Assessment - 02/08/19 1404    Subjective  She relays chronic Lt shoulder pain but much worse since fall in December 2020 and she landed on her shoulder. She does part time computer work 5 hours a day and has to stop every hour due to pain and discomfort in her shoulder. She has also had chronic neck pain with some N/T in her last 3 digits since October 2020.    Pertinent History  lupus    Limitations  House hold activities;Lifting    Diagnostic tests  Lt shoulder XR "Mild degenerative changes at the [facility] joint, no acute changes    Patient Stated Goals  get the pain down, and get my arm back    Currently in Pain?  Yes    Pain Score  7     Pain Location  Shoulder    Pain Orientation  Left    Pain Descriptors / Indicators  Sharp;Tingling;Numbness    Pain Type  Other (Comment)   acute on chronic   Pain Radiating Towards  from Left shoulder radiating to her elbow, no N/T in her arm but in her last 3 fingers since october    Pain Onset  More than a month ago    Pain Frequency  Constant    Aggravating Factors   laying on it, reaching up or behind back, lifting, carrying    Pain Relieving Factors  maybe voltaren gel, rest, avoid surgery if possible    Multiple Pain Sites  No         [facility] PT Assessment - 02/08/19 0001      Assessment   Medical Diagnosis  acute on Chronic Lt shoulder pain    Referring Provider (PT)  Garald Balding, MD    Onset Date/Surgical Date  --   December 2020   Hand Dominance  Right    Next MD Visit  PRN    Prior Therapy  none  Precautions   Precautions  None      Restrictions   Weight Bearing Restrictions  No      Balance Screen   Has the patient fallen in the past 6 months  Yes    How many times?  1   this incidence, fell off counter trying to hang curtains     Home Environment   Living Environment  Private residence      Prior Function   Level of Independence  Independent      Cognition   Overall Cognitive Status  Within Functional Limits for tasks assessed      Sensation   Light Touch  Appears Intact      Coordination   Gross Motor Movements are Fluid and Coordinated  Yes      ROM / Strength   AROM / PROM / Strength  AROM;PROM;Strength      AROM   Overall AROM Comments  bilat shoulder ROM WNL except Lt shoulder IR limited to L4 functional reach behind back      PROM   Overall PROM Comments  WNL Lt shoulder PROM and GH mobility      Strength   Overall Strength Comments  Rt shoulder 5/5    Strength Assessment Site  Shoulder    Right/Left Shoulder  Left    Left Shoulder Flexion  4+/5    Left Shoulder ABduction  4+/5    Left Shoulder Internal Rotation  4+/5     Left Shoulder External Rotation  4+/5      Palpation   Palpation comment  On Left side: TTP supraspinatus and upper trap, down lateral arm      Special Tests   Other special tests  Lt shoulder + impingement tests, painful arc, negative clunk test, neg biceps load test. Neg drop arm test, neg bell press test. For cervical, negative spurlings test, maybe some decreased N/T in fingers with neck retractions      Transfers   Transfers  Independent with all Transfers      Ambulation/Gait   Gait Comments  WFL                Objective measurements completed on examination: See above findings.      [facility] Adult PT Treatment/Exercise - 02/08/19 0001      Exercises   Exercises  Shoulder;Other Exercises    Other Exercises   neck retractios X 10 reps slight reduction in N/T in last 3 fingers      Modalities   Modalities  Electrical Stimulation;Moist Heat      Moist Heat Therapy   Number Minutes Moist Heat  10 Minutes    Moist Heat Location  Shoulder      Electrical Stimulation   Electrical Stimulation Location  Lt shoulder    Electrical Stimulation Action  IFC    Electrical Stimulation Parameters  tolerance    Electrical Stimulation Goals  Pain             PT Education - 02/08/19 1518    Education Details  HEP, POC, exam findings, TENS    Person(s) Educated  Patient    Methods  Explanation;Demonstration;Handout;Other (comment)    Comprehension  Verbalized understanding;Need further instruction          PT Long Term Goals - 02/08/19 1529      PT LONG TERM GOAL #1   Title  Pt will be I and compliant with HEP (target for all goals 6 weeks 03/22/19)  Status  New      PT LONG TERM GOAL #2   Title  Pt will improve Lt shoulder IR AROM to WNL and equal to her Rt shoulder    Status  New      PT LONG TERM GOAL #3   Title  Pt will improve Lt shoulder strength to 5/5 MMT to improve function.    Status  New      PT LONG TERM GOAL #4   Title  Pt will report  overall less than 3/10 Lt shoulder pain with ususal activity, reaching, carrying, sleeping.    Status  New             Plan - 02/08/19 1522    Clinical Impression Statement  Pt presents with acute on chronic Lt shoulder pain with impingement/tendonitits. She has overall increased Lt shoulder pain, decreased strength, and decreased activity tolerance for reaching, lifitng, carrying, and sleeping on her Lt side. She will benefit from skilled PT to address her deficits.    Personal Factors and Comorbidities  Comorbidity 1    Comorbidities  lupus    Examination-Activity Limitations  Reach Overhead;Lift;Carry    Examination-Participation Restrictions  Cleaning;Driving    Stability/Clinical Decision Making  Evolving/Moderate complexity    Clinical Decision Making  Moderate    Rehab Potential  Good    PT Frequency  2x / week   1-2   PT Duration  6 weeks    PT Treatment/Interventions  Cryotherapy;Electrical Stimulation;Iontophoresis 4mg /ml Dexamethasone;Moist Heat;Traction;Ultrasound;Therapeutic activities;Therapeutic exercise;Neuromuscular re-education;Manual techniques;Passive range of motion;Dry needling;Joint Manipulations;Taping    PT Next Visit Plan  review and update HEP PRN, consider MT or modalaties for pain, needs RTC/scap stabilization    PT Home Exercise Plan  Access Code: [email or web address]    Consulted and Agree with Plan of Care  Patient       Patient will benefit from skilled therapeutic intervention in order to improve the following deficits and impairments:  Decreased activity tolerance, Decreased endurance, Decreased range of motion, Decreased strength, Increased fascial restricitons, Increased muscle spasms, Impaired flexibility, Postural dysfunction, Pain  Visit Diagnosis: Chronic left shoulder pain  Muscle weakness (generalized)  Cervicalgia     Problem List Patient Active Problem List   Diagnosis Date Noted  . Pain in left shoulder 02/04/2019  . Bradycardia  06/07/2014  . Benign essential HTN 06/07/2014  . Lupus (systemic lupus erythematosus) ([facility]) 06/07/2014    06/09/2014 02/08/2019, 8:13 PM  [facility] Therapy [address] [place], [place], [place] Phone: [phone]   Fax:  [phone]  Name: Jill Mcintosh MRN: [name] Date of Birth: [birth year]

## 2019-02-09 ENCOUNTER — Encounter: Payer: BC Managed Care – PPO | Admitting: Neurology

## 2019-02-12 ENCOUNTER — Other Ambulatory Visit: Payer: Self-pay

## 2019-02-12 ENCOUNTER — Ambulatory Visit: Payer: BC Managed Care – PPO | Admitting: Physical Therapy

## 2019-02-12 DIAGNOSIS — M542 Cervicalgia: Secondary | ICD-10-CM

## 2019-02-12 DIAGNOSIS — M25512 Pain in left shoulder: Secondary | ICD-10-CM

## 2019-02-12 DIAGNOSIS — M6281 Muscle weakness (generalized): Secondary | ICD-10-CM

## 2019-02-12 DIAGNOSIS — G8929 Other chronic pain: Secondary | ICD-10-CM | POA: Diagnosis not present

## 2019-02-14 ENCOUNTER — Encounter: Payer: Self-pay | Admitting: Physical Therapy

## 2019-02-14 NOTE — Therapy (Addendum)
[facility] [address] [place], [place], [address] Phone: [phone]   Fax:  [phone]  Physical Therapy Treatment  Patient Details  Name: Jill Mcintosh MRN: [id number] Date of Birth: [birth year] Referring Provider (PT): Valeria Batman, MD    Visit number 2  Time in 1400 Time out 95:09  Re-cert: 04/02/7122   Encounter Date: 02/12/2019   Subjective:Patient reports she is still having high levels of pain particualrly at night. She reports external rotation and internal rotation stretching are causing increased pain.     Past Medical History:  Diagnosis Date  . Allergic rhinitis   . Anxiety   . Bradycardia   . H/O bilateral breast reduction surgery   . Hypertension   . Insomnia   . Lupus ([facility])   . Mild tricuspid regurgitation   . Raynaud's syndrome   . Sicca syndrome ([facility])   . Sjogren's disease ([facility])   . Thyroid disease   . Vitamin D deficiency     Past Surgical History:  Procedure Laterality Date  . BREAST REDUCTION SURGERY    . CHOLECYSTECTOMY    . NASAL SINUS SURGERY    . REDUCTION MAMMAPLASTY Bilateral   . TONSILLECTOMY      There were no vitals filed for this visit.  Subjective Assessment - 02/14/19 1154    Subjective  Patient reports she is still having high levels of pain particualrly at night. She reports external rotation and internal rotation stretching are causing increased pain.    Pertinent History  lupus    Limitations  House hold activities;Lifting    Diagnostic tests  Lt shoulder XR "Mild degenerative changes at the [facility] joint, no acute changes    Patient Stated Goals  get the pain down, and get my arm back    Currently in Pain?  Yes    Pain Score  7     Pain Location  Shoulder    Pain Orientation  Left    Pain Descriptors / Indicators  Aching    Pain Type  Chronic pain    Pain Onset  More than a month ago    Pain Frequency  Constant    Aggravating Factors   laying on her shoulder reaching on  her back    Pain Relieving Factors  voltarin    Effect of Pain on Daily Activities  pain sleeping                       [facility] Adult PT Treatment/Exercise - 02/14/19 0001      Transfers   Transfers  Independent with all Transfers      Ambulation/Gait   Gait Comments  [facility]      Self-Care   Self-Care  Other Self-Care Comments    Other Self-Care Comments   reviewed positioning when sleeping and reviewed anatomy of the shoulder       Exercises   Exercises  Shoulder;Other Exercises    Other Exercises   --      Shoulder Exercises: Supine   Other Supine Exercises  wand flexionx10 educated patient not to go through painful ranges. Mod vc's for range       Shoulder Exercises: Sidelying   Other Sidelying Exercises  ER in pain free ranges x10       Shoulder Exercises: Standing   External Rotation Limitations  held today 2nd to pain given side lying instead     Internal Rotation Limitations  2x10 L1 band mod cuing  for technique     Extension Limitations  2x10 L1     Row Limitations  2x10 L1       Modalities   Modalities  Electrical Stimulation;Moist Heat   considered I[place] but no Dex available      Moist Heat Therapy   Moist Heat Location  Shoulder      Electrical Stimulation   Electrical Stimulation Location  Lt shoulder    Electrical Stimulation Action  IFC    Electrical Stimulation Parameters  to tolerance     Electrical Stimulation Goals  Pain      Manual Therapy   Manual Therapy  Joint mobilization;Soft tissue mobilization    Joint Mobilization  inferior glides and A{P glides to decrease pain and decrease impingement     Soft tissue mobilization  to left upper trap and posterior shoulder          Plan - 02/17/19 0756    Clinical Impression Statement  Patient tolerated treatment well. She had reported increased pain with ER with band and towel stretch. She was advised to hold off on those stretches for now until her inflammation decreases. She was given low  range side lying er instead and was able to do without pain. Therapyfocused on manual therapy to reduce upper trap spasm and grade II and II mbilizations for pain. She reported no significant increase in pain with treament. She was given adjusted for home. She was also advised to try using a thin pillow under her arm at night to keep her arm in a good position and prevent her from rolling on it. She will also try ice at home.    Comorbidities  lupus    Examination-Activity Limitations  Reach Overhead;Lift;Carry    Examination-Participation Restrictions  Cleaning;Driving    Stability/Clinical Decision Making  Evolving/Moderate complexity    Rehab Potential  Good    PT Frequency  2x / week    PT Duration  6 weeks    PT Treatment/Interventions  Cryotherapy;Electrical Stimulation;Iontophoresis 4mg /ml Dexamethasone;Moist Heat;Traction;Ultrasound;Therapeutic activities;Therapeutic exercise;Neuromuscular re-education;Manual techniques;Passive range of motion;Dry needling;Joint Manipulations;Taping    PT Next Visit Plan  review and update HEP PRN, consider MT or modalaties for pain, needs RTC/scap stabilization    PT Home Exercise Plan  Access Code: [email or web address]    Consulted and Agree with Plan of Care  Patient            PT Education - 02/14/19 1156    Education Details  reviewed HEP    Person(s) Educated  Patient    Methods  Explanation;Demonstration;Tactile cues;Verbal cues    Comprehension  Verbalized understanding;Returned demonstration;Tactile cues required;Verbal cues required;Need further instruction          PT Long Term Goals - 02/08/19 1529      PT LONG TERM GOAL #1   Title  Pt will be I and compliant with HEP (target for all goals 6 weeks 03/22/19)    Status  New      PT LONG TERM GOAL #2   Title  Pt will improve Lt shoulder IR AROM to WNL and equal to her Rt shoulder    Status  New      PT LONG TERM GOAL #3   Title  Pt will improve Lt shoulder strength to 5/5 MMT to improve  function.    Status  New      PT LONG TERM GOAL #4   Title  Pt will report overall less than 3/10 Lt shoulder pain with ususal  activity, reaching, carrying, sleeping.    Status  New              Patient will benefit from skilled therapeutic intervention in order to improve the following deficits and impairments:  Decreased activity tolerance, Decreased endurance, Decreased range of motion, Decreased strength, Increased fascial restricitons, Increased muscle spasms, Impaired flexibility, Postural dysfunction, Pain  Visit Diagnosis: Chronic left shoulder pain  Muscle weakness (generalized)  Cervicalgia     Problem List Patient Active Problem List   Diagnosis Date Noted  . Pain in left shoulder 02/04/2019  . Bradycardia 06/07/2014  . Benign essential HTN 06/07/2014  . Lupus (systemic lupus erythematosus) (HCC) 06/07/2014    Dessie Coma  PT DPT  02/14/2019, 12:02 PM  [facility] Physical Therapy [address] [place], [place], [address] Phone: [phone]   Fax:  [phone]  Name: Jill Mcintosh MRN: [id number] Date of Birth: [birth year]

## 2019-02-16 ENCOUNTER — Encounter: Payer: Self-pay | Admitting: Physical Therapy

## 2019-02-16 ENCOUNTER — Other Ambulatory Visit: Payer: Self-pay

## 2019-02-16 ENCOUNTER — Ambulatory Visit: Payer: BC Managed Care – PPO | Admitting: Physical Therapy

## 2019-02-16 DIAGNOSIS — G8929 Other chronic pain: Secondary | ICD-10-CM | POA: Diagnosis not present

## 2019-02-16 DIAGNOSIS — M6281 Muscle weakness (generalized): Secondary | ICD-10-CM | POA: Diagnosis not present

## 2019-02-16 DIAGNOSIS — M542 Cervicalgia: Secondary | ICD-10-CM | POA: Diagnosis not present

## 2019-02-16 DIAGNOSIS — M25512 Pain in left shoulder: Secondary | ICD-10-CM | POA: Diagnosis not present

## 2019-02-16 NOTE — Therapy (Signed)
[facility] Physical Therapy [address] [place], [place], [address] Phone: [phone]   Fax:  [phone]  Physical Therapy Treatment  Patient Details  Name: Jill Mcintosh MRN: [id number] Date of Birth: 1960-07-23 Referring Provider (PT): Valeria Batman, MD   Encounter Date: 02/16/2019  PT End of Session - 02/16/19 1605    Visit Number  3    Number of Visits  12    Date for PT Re-Evaluation  03/22/19    Authorization Type  BCBS    PT Start Time  1526    PT Stop Time  1605    PT Time Calculation (min)  39 min    Activity Tolerance  Patient tolerated treatment well    Behavior During Therapy  [facility] for tasks assessed/performed       Past Medical History:  Diagnosis Date  . Allergic rhinitis   . Anxiety   . Bradycardia   . H/O bilateral breast reduction surgery   . Hypertension   . Insomnia   . Lupus ([facility])   . Mild tricuspid regurgitation   . Raynaud's syndrome   . Sicca syndrome ([facility])   . Sjogren's disease ([facility])   . Thyroid disease   . Vitamin D deficiency     Past Surgical History:  Procedure Laterality Date  . BREAST REDUCTION SURGERY    . CHOLECYSTECTOMY    . NASAL SINUS SURGERY    . REDUCTION MAMMAPLASTY Bilateral   . TONSILLECTOMY      There were no vitals filed for this visit.  Subjective Assessment - 02/16/19 1526    Subjective  shoulder is about the same, no significant change at this time.    Pertinent History  lupus    Limitations  House hold activities;Lifting    Diagnostic tests  Lt shoulder XR "Mild degenerative changes at the [facility] joint, no acute changes    Patient Stated Goals  get the pain down, and get my arm back    Currently in Pain?  No/denies    Pain Onset  More than a month ago                       [facility] Adult PT Treatment/Exercise - 02/16/19 1528      Shoulder Exercises: Standing   Row  20 reps;Theraband    Theraband Level (Shoulder Row)  Level 1 (Yellow)    Other Standing Exercises   IR with 1# bar x 15 reps      Shoulder Exercises: ROM/Strengthening   UBE (Upper Arm Bike)  L1 x 4 min (2' each direction)    [place]anger  flexion, circles, scaption x 15 reps      Manual Therapy   Manual therapy comments  trigger points noted in distal subscap    Joint Mobilization  inferior glides and A{P glides to decrease pain and decrease impingement     Soft tissue mobilization  to left upper trap and posterior shoulder              PT Education - 02/16/19 1605    Education Details  DN    Person(s) Educated  Patient    Methods  Explanation;Handout    Comprehension  Verbalized understanding          PT Long Term Goals - 02/08/19 1529      PT LONG TERM GOAL #1   Title  Pt will be I and compliant with HEP (target for all goals 6 weeks 03/22/19)  Status  New      PT LONG TERM GOAL #2   Title  Pt will improve Lt shoulder IR AROM to WNL and equal to her Rt shoulder    Status  New      PT LONG TERM GOAL #3   Title  Pt will improve Lt shoulder strength to 5/5 MMT to improve function.    Status  New      PT LONG TERM GOAL #4   Title  Pt will report overall less than 3/10 Lt shoulder pain with ususal activity, reaching, carrying, sleeping.    Status  New            Plan - 02/16/19 1605    Clinical Impression Statement  Pt reported decreased tightness following session, and noticed active trigger points in distal subscap today during manual therapy.  May benefit from trial of DN, pt unsure at this time.  Will continue to benefit from PT to maximize function.    Comorbidities  lupus    Examination-Activity Limitations  Reach Overhead;Lift;Carry    Examination-Participation Restrictions  Cleaning;Driving    Stability/Clinical Decision Making  Evolving/Moderate complexity    Rehab Potential  Good    PT Frequency  2x / week    PT Duration  6 weeks    PT Treatment/Interventions  Cryotherapy;Electrical Stimulation;Iontophoresis 4mg /ml Dexamethasone;Moist  Heat;Traction;Ultrasound;Therapeutic activities;Therapeutic exercise;Neuromuscular re-education;Manual techniques;Passive range of motion;Dry needling;Joint Manipulations;Taping    PT Next Visit Plan  consider MT or modalaties for pain, needs RTC/scap stabilization; possible DN; update HEP when needed    PT Home Exercise Plan  Access Code: [email or web address]    Consulted and Agree with Plan of Care  Patient       Patient will benefit from skilled therapeutic intervention in order to improve the following deficits and impairments:  Decreased activity tolerance, Decreased endurance, Decreased range of motion, Decreased strength, Increased fascial restricitons, Increased muscle spasms, Impaired flexibility, Postural dysfunction, Pain  Visit Diagnosis: Chronic left shoulder pain  Muscle weakness (generalized)  Cervicalgia     Problem List Patient Active Problem List   Diagnosis Date Noted  . Pain in left shoulder 02/04/2019  . Bradycardia 06/07/2014  . Benign essential HTN 06/07/2014  . Lupus (systemic lupus erythematosus) ([place]) 06/07/2014      Laureen Abrahams, PT, DPT 02/16/19 4:07 PM     [facility] [address] [place], [place], [address] Phone: [phone]   Fax:  [phone]  Name: Jill Mcintosh MRN: [id number] Date of Birth: 08-Apr-1960

## 2019-02-16 NOTE — Patient Instructions (Signed)

## 2019-02-22 ENCOUNTER — Ambulatory Visit: Payer: BC Managed Care – PPO | Admitting: Physical Therapy

## 2019-02-22 ENCOUNTER — Other Ambulatory Visit: Payer: Self-pay

## 2019-02-22 DIAGNOSIS — M25512 Pain in left shoulder: Secondary | ICD-10-CM

## 2019-02-22 DIAGNOSIS — M6281 Muscle weakness (generalized): Secondary | ICD-10-CM

## 2019-02-22 DIAGNOSIS — M542 Cervicalgia: Secondary | ICD-10-CM

## 2019-02-22 DIAGNOSIS — R600 Localized edema: Secondary | ICD-10-CM | POA: Diagnosis not present

## 2019-02-22 DIAGNOSIS — G8929 Other chronic pain: Secondary | ICD-10-CM

## 2019-02-22 NOTE — Therapy (Addendum)
[facility] [address] [place], [place], [address] Phone: [phone]   Fax:  [phone]  Physical Therapy Treatment/Discharge addendum PHYSICAL THERAPY DISCHARGE SUMMARY  Visits from Start of Care: 4  Current functional level related to goals / functional outcomes: See below   Remaining deficits: See below   Education / Equipment: HEP  Plan: Patient agrees to discharge.  Patient goals were not met. Patient is being discharged due to financial reasons.  ?????     Do to higher copay at this location, she called to let PT know she will attend PT elsewhere that is in network with her insurance.    Patient Details  Name: Jill Mcintosh MRN: [id number] Date of Birth: Aug 19, 1960 Referring Provider (PT): Garald Balding, MD   Encounter Date: 02/22/2019  PT End of Session - 02/22/19 1611    Visit Number  4    Number of Visits  12    Date for PT Re-Evaluation  03/22/19    Authorization Type  BCBS    PT Start Time  0034    PT Stop Time  9179    PT Time Calculation (min)  45 min       Past Medical History:  Diagnosis Date  . Allergic rhinitis   . Anxiety   . Bradycardia   . H/O bilateral breast reduction surgery   . Hypertension   . Insomnia   . Lupus ([place])   . Mild tricuspid regurgitation   . Raynaud's syndrome   . Sicca syndrome ([place])   . Sjogren's disease ([place])   . Thyroid disease   . Vitamin D deficiency     Past Surgical History:  Procedure Laterality Date  . BREAST REDUCTION SURGERY    . CHOLECYSTECTOMY    . NASAL SINUS SURGERY    . REDUCTION MAMMAPLASTY Bilateral   . TONSILLECTOMY      There were no vitals filed for this visit.  Subjective Assessment - 02/22/19 1533    Subjective  My shoulder and neck are getting a little better, I can tell I have more ROM, but still having pain and some N/T in fingers    Pertinent History  lupus    Limitations  House hold activities;Lifting    Diagnostic tests  Lt  shoulder XR "Mild degenerative changes at the [facility] joint, no acute changes    Patient Stated Goals  get the pain down, and get my arm back    Currently in Pain?  Yes    Pain Score  5     Pain Location  Shoulder    Pain Orientation  Left    Pain Onset  More than a month ago                       [facility] Adult PT Treatment/Exercise - 02/22/19 0001      Shoulder Exercises: Sidelying   External Rotation  Left;10 reps    External Rotation Limitations  3 sets      Shoulder Exercises: Standing   Extension  20 reps    Theraband Level (Shoulder Extension)  Level 2 (Red)    Row  20 reps;Theraband    Theraband Level (Shoulder Row)  Level 2 (Red)    Other Standing Exercises  IR behind back with 1# bar x 15 reps, ER/IR AAROM  X15 reps at waist level in front      Shoulder Exercises: ROM/Strengthening   UBE (Upper Arm Bike)  L1.5 x  4 min (2' each direction)    Ranger  flexion, circles, scaption x 15 reps    Wall Wash  wall ladder X 5 reps into flexion and scaption      Shoulder Exercises: Stretch   Corner Stretch Limitations  doorway stretch one rep low with bilat arms 30 sec X 1, then one rep with Lt arm only GH stabilized on doorframe and turning her body away for stretch 30 sec X 1 reps      Modalities   Modalities  Electrical Stimulation;Vasopneumatic      Electrical Stimulation   Electrical Stimulation Location  Lt shoulder    Electrical Stimulation Action  IFC    Electrical Stimulation Parameters  tolerance    Electrical Stimulation Goals  Pain      Vasopneumatic   Number Minutes Vasopneumatic   10 minutes    Vasopnuematic Location   Shoulder    Vasopneumatic Pressure  Low    Vasopneumatic Temperature   34      Manual Therapy   Manual therapy comments  PROM to Lt shoulder gentle to tolerance all planes    Joint Mobilization  distraction glides, inferior glides and A{P glides to decrease pain and decrease impingement     Soft tissue mobilization  to left shoulder  anterior, lateral, posterior with STM/IASTM                  PT Long Term Goals - 02/08/19 1529      PT LONG TERM GOAL #1   Title  Pt will be I and compliant with HEP (target for all goals 6 weeks 03/22/19)    Status  New      PT LONG TERM GOAL #2   Title  Pt will improve Lt shoulder IR AROM to WNL and equal to her Rt shoulder    Status  New      PT LONG TERM GOAL #3   Title  Pt will improve Lt shoulder strength to 5/5 MMT to improve function.    Status  New      PT LONG TERM GOAL #4   Title  Pt will report overall less than 3/10 Lt shoulder pain with ususal activity, reaching, carrying, sleeping.    Status  New            Plan - 02/22/19 1611    Clinical Impression Statement  She has made progress with ROM with subjective as well as visual assessment. PT will measure this next visit. She is having overall less pain this week now that ROM has improved. Continued to focus on ROM and scapular stabilization/posture as tolerated. Continued with TENS and added vasopnuematic today for edema/inflammaiton/soreness post exercise.. Continue POC    Comorbidities  lupus    Examination-Activity Limitations  Reach Overhead;Lift;Carry    Examination-Participation Restrictions  Cleaning;Driving    Stability/Clinical Decision Making  Evolving/Moderate complexity    Rehab Potential  Good    PT Frequency  2x / week    PT Duration  6 weeks    PT Treatment/Interventions  Cryotherapy;Electrical Stimulation;Iontophoresis 45m/ml Dexamethasone;Moist Heat;Traction;Ultrasound;Therapeutic activities;Therapeutic exercise;Neuromuscular re-education;Manual techniques;Passive range of motion;Dry needling;Joint Manipulations;Taping    PT Next Visit Plan  review and update HEP PRN, consider MT or modalaties for pain, needs RTC/scap stabilization    PT Home Exercise Plan  Access Code: [email or web address]   Consulted and Agree with Plan of Care  Patient       Patient will benefit from skilled therapeutic  intervention in  order to improve the following deficits and impairments:  Decreased activity tolerance, Decreased endurance, Decreased range of motion, Decreased strength, Increased fascial restricitons, Increased muscle spasms, Impaired flexibility, Postural dysfunction, Pain  Visit Diagnosis: Chronic left shoulder pain  Muscle weakness (generalized)  Cervicalgia  Localized edema     Problem List Patient Active Problem List   Diagnosis Date Noted  . Pain in left shoulder 02/04/2019  . Bradycardia 06/07/2014  . Benign essential HTN 06/07/2014  . Lupus (systemic lupus erythematosus) ([place]) 06/07/2014    Jill Mcintosh 02/22/2019, 4:22 PM  [facility] [address] [place], [place], [address] Phone: [phone]   Fax:  [phone]  Name: Jill Mcintosh MRN: [id number] Date of Birth: [birth year]

## 2019-03-02 ENCOUNTER — Encounter: Payer: BC Managed Care – PPO | Admitting: Physical Therapy

## 2019-03-04 ENCOUNTER — Encounter: Payer: BC Managed Care – PPO | Admitting: Physical Therapy

## 2019-03-09 ENCOUNTER — Encounter: Payer: BC Managed Care – PPO | Admitting: Physical Therapy

## 2019-03-11 ENCOUNTER — Encounter: Payer: BC Managed Care – PPO | Admitting: Physical Therapy

## 2019-04-14 ENCOUNTER — Other Ambulatory Visit: Payer: Self-pay | Admitting: Family Medicine

## 2019-04-14 DIAGNOSIS — Z1231 Encounter for screening mammogram for malignant neoplasm of breast: Secondary | ICD-10-CM

## 2019-04-16 ENCOUNTER — Other Ambulatory Visit: Payer: Self-pay

## 2019-04-16 ENCOUNTER — Ambulatory Visit
Admission: RE | Admit: 2019-04-16 | Discharge: 2019-04-16 | Disposition: A | Discharge status: Home / Self Care | Payer: BC Managed Care – PPO | Source: Ambulatory Visit

## 2019-04-16 DIAGNOSIS — Z1231 Encounter for screening mammogram for malignant neoplasm of breast: Secondary | ICD-10-CM

## 2020-01-09 ENCOUNTER — Emergency Department (HOSPITAL_COMMUNITY): Payer: BC Managed Care – PPO | Admitting: Certified Registered Nurse Anesthetist

## 2020-01-09 ENCOUNTER — Encounter (HOSPITAL_COMMUNITY): Payer: Self-pay | Admitting: Emergency Medicine

## 2020-01-09 ENCOUNTER — Emergency Department (HOSPITAL_COMMUNITY): Payer: BC Managed Care – PPO

## 2020-01-09 ENCOUNTER — Other Ambulatory Visit: Payer: Self-pay

## 2020-01-09 ENCOUNTER — Observation Stay (HOSPITAL_COMMUNITY)
Admission: EM | Admit: 2020-01-09 | Discharge: 2020-01-10 | Disposition: A | Discharge status: Home / Self Care | Payer: BC Managed Care – PPO | Attending: Physician Assistant | Admitting: Physician Assistant

## 2020-01-09 ENCOUNTER — Encounter (HOSPITAL_COMMUNITY)
Admission: EM | Disposition: A | Discharge status: Home / Self Care | Payer: Self-pay | Source: Home / Self Care | Attending: Emergency Medicine

## 2020-01-09 DIAGNOSIS — R1031 Right lower quadrant pain: Secondary | ICD-10-CM | POA: Diagnosis present

## 2020-01-09 DIAGNOSIS — Z79899 Other long term (current) drug therapy: Secondary | ICD-10-CM | POA: Insufficient documentation

## 2020-01-09 DIAGNOSIS — I10 Essential (primary) hypertension: Secondary | ICD-10-CM | POA: Diagnosis not present

## 2020-01-09 DIAGNOSIS — K3531 Acute appendicitis with localized peritonitis and gangrene, without perforation: Principal | ICD-10-CM | POA: Insufficient documentation

## 2020-01-09 DIAGNOSIS — Z9049 Acquired absence of other specified parts of digestive tract: Secondary | ICD-10-CM

## 2020-01-09 DIAGNOSIS — Z20822 Contact with and (suspected) exposure to covid-19: Secondary | ICD-10-CM | POA: Insufficient documentation

## 2020-01-09 DIAGNOSIS — K3580 Unspecified acute appendicitis: Secondary | ICD-10-CM

## 2020-01-09 HISTORY — PX: LAPAROSCOPIC APPENDECTOMY: SHX408

## 2020-01-09 LAB — CBC
HCT: 41.1 % (ref 36.0–46.0)
Hemoglobin: 13.7 g/dL (ref 12.0–15.0)
MCH: 28.9 pg (ref 26.0–34.0)
MCHC: 33.3 g/dL (ref 30.0–36.0)
MCV: 86.7 fL (ref 80.0–100.0)
Platelets: 254 10*3/uL (ref 150–400)
RBC: 4.74 MIL/uL (ref 3.87–5.11)
RDW: 13 % (ref 11.5–15.5)
WBC: 9.3 10*3/uL (ref 4.0–10.5)
nRBC: 0 % (ref 0.0–0.2)

## 2020-01-09 LAB — COMPREHENSIVE METABOLIC PANEL
ALT: 54 U/L — ABNORMAL HIGH (ref 0–44)
AST: 37 U/L (ref 15–41)
Albumin: 4.5 g/dL (ref 3.5–5.0)
Alkaline Phosphatase: 56 U/L (ref 38–126)
Anion gap: 11 (ref 5–15)
BUN: 11 mg/dL (ref 6–20)
CO2: 24 mmol/L (ref 22–32)
Calcium: 9.8 mg/dL (ref 8.9–10.3)
Chloride: 99 mmol/L (ref 98–111)
Creatinine, Ser: 0.83 mg/dL (ref 0.44–1.00)
GFR, Estimated: >60 mL/min (ref >60)
Glucose, Bld: 132 mg/dL — ABNORMAL HIGH (ref 70–99)
Potassium: 4.2 mmol/L (ref 3.5–5.1)
Sodium: 134 mmol/L — ABNORMAL LOW (ref 135–145)
Total Bilirubin: 0.8 mg/dL (ref 0.3–1.2)
Total Protein: 9.2 g/dL — ABNORMAL HIGH (ref 6.5–8.1)

## 2020-01-09 LAB — URINALYSIS, ROUTINE W REFLEX MICROSCOPIC
Bilirubin Urine: NEGATIVE
Glucose, UA: NEGATIVE mg/dL
Hgb urine dipstick: NEGATIVE
Ketones, ur: NEGATIVE mg/dL
Leukocytes,Ua: NEGATIVE
Nitrite: NEGATIVE
Protein, ur: NEGATIVE mg/dL
Specific Gravity, Urine: 1.024 (ref 1.005–1.030)
pH: 5 (ref 5.0–8.0)

## 2020-01-09 LAB — I-STAT BETA HCG BLOOD, ED (MC, WL, AP ONLY): I-stat hCG, quantitative: <5 m[IU]/mL (ref <5)

## 2020-01-09 LAB — RESP PANEL BY RT-PCR (FLU A&B, COVID) ARPGX2
Influenza A by PCR: NEGATIVE
Influenza B by PCR: NEGATIVE
SARS Coronavirus 2 by RT PCR: NEGATIVE

## 2020-01-09 LAB — LIPASE, BLOOD: Lipase: 29 U/L (ref 11–51)

## 2020-01-09 SURGERY — APPENDECTOMY, LAPAROSCOPIC
Anesthesia: General

## 2020-01-09 MED ORDER — HYDROXYCHLOROQUINE SULFATE 200 MG PO TABS
200.0000 mg | ORAL_TABLET | Freq: Every day | ORAL | Status: DC
  Administered 2020-01-10: 09:00:00 200 mg via ORAL
  Filled 2020-01-09: qty 1

## 2020-01-09 MED ORDER — IBUPROFEN 400 MG PO TABS: 600.0000 mg | ORAL_TABLET | Freq: Four times a day (QID) | ORAL | Status: DC | PRN

## 2020-01-09 MED ORDER — LISINOPRIL-HYDROCHLOROTHIAZIDE 10-12.5 MG PO TABS: 1.0000 | ORAL_TABLET | Freq: Every day | ORAL | Status: DC

## 2020-01-09 MED ORDER — LACTATED RINGERS IR SOLN
Status: DC | PRN
  Administered 2020-01-09: 1000 mL

## 2020-01-09 MED ORDER — LACTATED RINGERS IV SOLN: INTRAVENOUS | Status: DC

## 2020-01-09 MED ORDER — PHENYLEPHRINE 40 MCG/ML (10ML) SYRINGE FOR IV PUSH (FOR BLOOD PRESSURE SUPPORT)
PREFILLED_SYRINGE | INTRAVENOUS | Status: DC | PRN
  Administered 2020-01-09: 80 ug via INTRAVENOUS
  Administered 2020-01-09: 40 ug via INTRAVENOUS

## 2020-01-09 MED ORDER — SUCCINYLCHOLINE CHLORIDE 200 MG/10ML IV SOSY
PREFILLED_SYRINGE | INTRAVENOUS | Status: AC
  Filled 2020-01-09: qty 10

## 2020-01-09 MED ORDER — LIDOCAINE HCL (PF) 2 % IJ SOLN
INTRAMUSCULAR | Status: AC
  Filled 2020-01-09: qty 5

## 2020-01-09 MED ORDER — MIDAZOLAM HCL 2 MG/2ML IJ SOLN
INTRAMUSCULAR | Status: AC
  Filled 2020-01-09: qty 2

## 2020-01-09 MED ORDER — PIPERACILLIN-TAZOBACTAM 3.375 G IVPB 30 MIN
3.3750 g | Freq: Once | INTRAVENOUS | Status: AC
  Administered 2020-01-09: 3.375 g via INTRAVENOUS
  Filled 2020-01-09: qty 50

## 2020-01-09 MED ORDER — ONDANSETRON 4 MG PO TBDP: 4.0000 mg | ORAL_TABLET | Freq: Four times a day (QID) | ORAL | Status: DC | PRN

## 2020-01-09 MED ORDER — HEPARIN SODIUM (PORCINE) 5000 UNIT/ML IJ SOLN
5000.0000 [IU] | Freq: Three times a day (TID) | INTRAMUSCULAR | Status: DC
  Administered 2020-01-10: 5000 [IU] via SUBCUTANEOUS
  Filled 2020-01-09: qty 1

## 2020-01-09 MED ORDER — ONDANSETRON HCL 4 MG/2ML IJ SOLN: 4.0000 mg | Freq: Once | INTRAMUSCULAR | Status: DC | PRN

## 2020-01-09 MED ORDER — LISINOPRIL 10 MG PO TABS
10.0000 mg | ORAL_TABLET | Freq: Every day | ORAL | Status: DC
  Administered 2020-01-10: 09:00:00 10 mg via ORAL
  Filled 2020-01-09: qty 1

## 2020-01-09 MED ORDER — FENTANYL CITRATE (PF) 100 MCG/2ML IJ SOLN
INTRAMUSCULAR | Status: AC
  Filled 2020-01-09: qty 2

## 2020-01-09 MED ORDER — OXYCODONE HCL 5 MG PO TABS: 5.0000 mg | ORAL_TABLET | Freq: Once | ORAL | Status: DC | PRN

## 2020-01-09 MED ORDER — HYDROMORPHONE HCL 1 MG/ML IJ SOLN: 0.5000 mg | INTRAMUSCULAR | Status: DC | PRN

## 2020-01-09 MED ORDER — DIPHENHYDRAMINE HCL 50 MG/ML IJ SOLN: 12.5000 mg | Freq: Four times a day (QID) | INTRAMUSCULAR | Status: DC | PRN

## 2020-01-09 MED ORDER — BUPIVACAINE-EPINEPHRINE (PF) 0.25% -1:200000 IJ SOLN
INTRAMUSCULAR | Status: DC | PRN
  Administered 2020-01-09: 50 mL

## 2020-01-09 MED ORDER — PIPERACILLIN-TAZOBACTAM 3.375 G IVPB
3.3750 g | Freq: Three times a day (TID) | INTRAVENOUS | Status: DC
  Administered 2020-01-09 – 2020-01-10 (×2): 3.375 g via INTRAVENOUS
  Filled 2020-01-09 (×3): qty 50

## 2020-01-09 MED ORDER — PRAVASTATIN SODIUM 20 MG PO TABS
20.0000 mg | ORAL_TABLET | Freq: Every day | ORAL | Status: DC
  Administered 2020-01-10: 20 mg via ORAL
  Filled 2020-01-09: qty 1

## 2020-01-09 MED ORDER — LACTATED RINGERS IV SOLN: INTRAVENOUS | Status: DC | PRN

## 2020-01-09 MED ORDER — PROPOFOL 10 MG/ML IV BOLUS
INTRAVENOUS | Status: AC
  Filled 2020-01-09: qty 20

## 2020-01-09 MED ORDER — MEPERIDINE HCL 50 MG/ML IJ SOLN: 6.2500 mg | INTRAMUSCULAR | Status: DC | PRN

## 2020-01-09 MED ORDER — ROCURONIUM BROMIDE 10 MG/ML (PF) SYRINGE
PREFILLED_SYRINGE | INTRAVENOUS | Status: DC | PRN
  Administered 2020-01-09: 70 mg via INTRAVENOUS

## 2020-01-09 MED ORDER — HYDRALAZINE HCL 20 MG/ML IJ SOLN: 10.0000 mg | INTRAMUSCULAR | Status: DC | PRN

## 2020-01-09 MED ORDER — ACETAMINOPHEN 500 MG PO TABS
1000.0000 mg | ORAL_TABLET | Freq: Four times a day (QID) | ORAL | Status: DC
  Administered 2020-01-09 – 2020-01-10 (×2): 1000 mg via ORAL
  Filled 2020-01-09 (×2): qty 2

## 2020-01-09 MED ORDER — ACETAMINOPHEN 325 MG PO TABS: 325.0000 mg | ORAL_TABLET | ORAL | Status: DC | PRN

## 2020-01-09 MED ORDER — PROPOFOL 10 MG/ML IV BOLUS
INTRAVENOUS | Status: DC | PRN
  Administered 2020-01-09: 150 mg via INTRAVENOUS

## 2020-01-09 MED ORDER — FENTANYL CITRATE (PF) 100 MCG/2ML IJ SOLN
INTRAMUSCULAR | Status: DC | PRN
  Administered 2020-01-09 (×4): 50 ug via INTRAVENOUS

## 2020-01-09 MED ORDER — ONDANSETRON HCL 4 MG/2ML IJ SOLN
INTRAMUSCULAR | Status: AC
  Filled 2020-01-09: qty 2

## 2020-01-09 MED ORDER — MORPHINE SULFATE (PF) 4 MG/ML IV SOLN
4.0000 mg | Freq: Once | INTRAVENOUS | Status: AC
  Administered 2020-01-09: 4 mg via INTRAVENOUS
  Filled 2020-01-09: qty 1

## 2020-01-09 MED ORDER — TRAMADOL HCL 50 MG PO TABS: 50.0000 mg | ORAL_TABLET | Freq: Four times a day (QID) | ORAL | Status: DC | PRN

## 2020-01-09 MED ORDER — SUGAMMADEX SODIUM 200 MG/2ML IV SOLN
INTRAVENOUS | Status: DC | PRN
  Administered 2020-01-09: 200 mg via INTRAVENOUS

## 2020-01-09 MED ORDER — OXYCODONE HCL 5 MG/5ML PO SOLN
5.0000 mg | Freq: Once | ORAL | Status: DC | PRN
Start: 2020-01-09 — End: 2020-01-09

## 2020-01-09 MED ORDER — ROCURONIUM BROMIDE 10 MG/ML (PF) SYRINGE
PREFILLED_SYRINGE | INTRAVENOUS | Status: AC
  Filled 2020-01-09: qty 10

## 2020-01-09 MED ORDER — HYDROCHLOROTHIAZIDE 12.5 MG PO CAPS
12.5000 mg | ORAL_CAPSULE | Freq: Every day | ORAL | Status: DC
  Administered 2020-01-10: 09:00:00 12.5 mg via ORAL
  Filled 2020-01-09: qty 1

## 2020-01-09 MED ORDER — ONDANSETRON HCL 4 MG/2ML IJ SOLN
4.0000 mg | Freq: Once | INTRAMUSCULAR | Status: AC
  Administered 2020-01-09: 4 mg via INTRAVENOUS
  Filled 2020-01-09: qty 2

## 2020-01-09 MED ORDER — IOHEXOL 300 MG/ML  SOLN
100.0000 mL | Freq: Once | INTRAMUSCULAR | Status: AC | PRN
  Administered 2020-01-09: 100 mL via INTRAVENOUS

## 2020-01-09 MED ORDER — ONDANSETRON HCL 4 MG/2ML IJ SOLN: 4.0000 mg | Freq: Four times a day (QID) | INTRAMUSCULAR | Status: DC | PRN

## 2020-01-09 MED ORDER — LIDOCAINE 2% (20 MG/ML) 5 ML SYRINGE
INTRAMUSCULAR | Status: DC | PRN
  Administered 2020-01-09: 80 mg via INTRAVENOUS

## 2020-01-09 MED ORDER — SIMETHICONE 80 MG PO CHEW: 40.0000 mg | CHEWABLE_TABLET | Freq: Four times a day (QID) | ORAL | Status: DC | PRN

## 2020-01-09 MED ORDER — ACETAMINOPHEN 325 MG PO TABS
650.0000 mg | ORAL_TABLET | Freq: Once | ORAL | Status: AC
  Administered 2020-01-09: 650 mg via ORAL
  Filled 2020-01-09: qty 2

## 2020-01-09 MED ORDER — DEXAMETHASONE SODIUM PHOSPHATE 10 MG/ML IJ SOLN
INTRAMUSCULAR | Status: AC
  Filled 2020-01-09: qty 1

## 2020-01-09 MED ORDER — FENTANYL CITRATE (PF) 100 MCG/2ML IJ SOLN
25.0000 ug | INTRAMUSCULAR | Status: DC | PRN
  Administered 2020-01-09 (×2): 50 ug via INTRAVENOUS

## 2020-01-09 MED ORDER — MIDAZOLAM HCL 5 MG/5ML IJ SOLN
INTRAMUSCULAR | Status: DC | PRN
  Administered 2020-01-09: 2 mg via INTRAVENOUS

## 2020-01-09 MED ORDER — DEXAMETHASONE SODIUM PHOSPHATE 10 MG/ML IJ SOLN
INTRAMUSCULAR | Status: DC | PRN
  Administered 2020-01-09: 5 mg via INTRAVENOUS

## 2020-01-09 MED ORDER — ONDANSETRON HCL 4 MG/2ML IJ SOLN
INTRAMUSCULAR | Status: DC | PRN
  Administered 2020-01-09: 4 mg via INTRAVENOUS

## 2020-01-09 MED ORDER — BUPIVACAINE-EPINEPHRINE 0.25% -1:200000 IJ SOLN
INTRAMUSCULAR | Status: AC
  Filled 2020-01-09: qty 1

## 2020-01-09 MED ORDER — LEVOTHYROXINE SODIUM 112 MCG PO TABS
112.0000 ug | ORAL_TABLET | Freq: Every day | ORAL | Status: DC
  Administered 2020-01-10: 112 ug via ORAL
  Filled 2020-01-09: qty 1

## 2020-01-09 MED ORDER — ACETAMINOPHEN 160 MG/5ML PO SOLN: 325.0000 mg | ORAL | Status: DC | PRN

## 2020-01-09 MED ORDER — DIPHENHYDRAMINE HCL 12.5 MG/5ML PO ELIX: 12.5000 mg | ORAL_SOLUTION | Freq: Four times a day (QID) | ORAL | Status: DC | PRN

## 2020-01-09 SURGICAL SUPPLY — 47 items
ADH SKN CLS APL DERMABOND .7 (GAUZE/BANDAGES/DRESSINGS) ×1
APL PRP STRL LF DISP 70% ISPRP (MISCELLANEOUS) ×1
APPLIER CLIP 5 13 M/L LIGAMAX5 (MISCELLANEOUS)
APPLIER CLIP ROT 10 11.4 M/L (STAPLE)
APR CLP MED LRG 11.4X10 (STAPLE)
APR CLP MED LRG 5 ANG JAW (MISCELLANEOUS)
BAG SPEC RTRVL LRG 6X4 10 (ENDOMECHANICALS) ×1
CABLE HIGH FREQUENCY MONO STRZ (ELECTRODE) IMPLANT
CHLORAPREP W/TINT 26 (MISCELLANEOUS) ×2 IMPLANT
CLIP APPLIE 5 13 M/L LIGAMAX5 (MISCELLANEOUS) IMPLANT
CLIP APPLIE ROT 10 11.4 M/L (STAPLE) IMPLANT
COVER SURGICAL LIGHT HANDLE (MISCELLANEOUS) ×2 IMPLANT
COVER WAND RF STERILE (DRAPES) ×2 IMPLANT
CUTTER FLEX LINEAR 45M (STAPLE) ×2 IMPLANT
DECANTER SPIKE VIAL GLASS SM (MISCELLANEOUS) ×2 IMPLANT
DERMABOND ADVANCED (GAUZE/BANDAGES/DRESSINGS) ×1
DERMABOND ADVANCED .7 DNX12 (GAUZE/BANDAGES/DRESSINGS) ×1 IMPLANT
DRAIN CHANNEL 19F RND (DRAIN) IMPLANT
ELECT PENCIL ROCKER SW 15FT (MISCELLANEOUS) ×2 IMPLANT
ELECT REM PT RETURN 15FT ADLT (MISCELLANEOUS) ×2 IMPLANT
ENDOLOOP SUT PDS II  0 18 (SUTURE)
ENDOLOOP SUT PDS II 0 18 (SUTURE) IMPLANT
EVACUATOR SILICONE 100CC (DRAIN) IMPLANT
GLOVE BIO SURGEON STRL SZ7.5 (GLOVE) ×2 IMPLANT
GLOVE INDICATOR 8.0 STRL GRN (GLOVE) ×2 IMPLANT
GOWN STRL REUS W/TWL XL LVL3 (GOWN DISPOSABLE) ×4 IMPLANT
KIT BASIN OR (CUSTOM PROCEDURE TRAY) ×2 IMPLANT
KIT TURNOVER KIT A (KITS) IMPLANT
POUCH SPECIMEN RETRIEVAL 10MM (ENDOMECHANICALS) ×2 IMPLANT
RELOAD 45 VASCULAR/THIN (ENDOMECHANICALS) IMPLANT
RELOAD STAPLE 45 2.5 WHT GRN (ENDOMECHANICALS) IMPLANT
RELOAD STAPLE 45 3.5 BLU ETS (ENDOMECHANICALS) IMPLANT
RELOAD STAPLE TA45 3.5 REG BLU (ENDOMECHANICALS) ×2 IMPLANT
SCISSORS LAP 5X35 DISP (ENDOMECHANICALS) IMPLANT
SET IRRIG TUBING LAPAROSCOPIC (IRRIGATION / IRRIGATOR) ×2 IMPLANT
SET TUBE SMOKE EVAC HIGH FLOW (TUBING) ×2 IMPLANT
SHEARS HARMONIC ACE PLUS 36CM (ENDOMECHANICALS) ×2 IMPLANT
SLEEVE ADV FIXATION 5X100MM (TROCAR) ×2 IMPLANT
SUT ETHILON 3 0 PS 1 (SUTURE) IMPLANT
SUT MNCRL AB 4-0 PS2 18 (SUTURE) ×2 IMPLANT
TOWEL OR 17X26 10 PK STRL BLUE (TOWEL DISPOSABLE) IMPLANT
TOWEL OR NON WOVEN STRL DISP B (DISPOSABLE) ×2 IMPLANT
TRAY FOLEY MTR SLVR 14FR STAT (SET/KITS/TRAYS/PACK) ×2 IMPLANT
TRAY FOLEY MTR SLVR 16FR STAT (SET/KITS/TRAYS/PACK) ×2 IMPLANT
TRAY LAPAROSCOPIC (CUSTOM PROCEDURE TRAY) ×2 IMPLANT
TROCAR ADV FIXATION 5X100MM (TROCAR) ×2 IMPLANT
TROCAR XCEL BLUNT TIP 100MML (ENDOMECHANICALS) ×2 IMPLANT

## 2020-01-09 NOTE — Transfer of Care (Signed)
Immediate Anesthesia Transfer of Care Note  Patient: Jill Mcintosh  Procedure(s) Performed: APPENDECTOMY LAPAROSCOPIC (N/A )  Patient Location: PACU  Anesthesia Type:General  Level of Consciousness: drowsy and patient cooperative  Airway & Oxygen Therapy: Patient Spontanous Breathing and Patient connected to face mask oxygen  Post-op Assessment: Report given to RN and Post -op Vital signs reviewed and stable  Post vital signs: Reviewed and stable  Last Vitals:  Vitals Value Taken Time  BP 150/79 1949  Temp 37.5 C 01/09/20 1949  Pulse 79 01/09/20 1949  Resp 22 01/09/20 1949  SpO2 100 % 01/09/20 1949  Vitals shown include unvalidated device data.  Last Pain:  Vitals:   01/09/20 1700  TempSrc:   PainSc: 8          Complications: No complications documented.

## 2020-01-09 NOTE — Anesthesia Preprocedure Evaluation (Addendum)
Anesthesia Evaluation  Patient identified by MRN, date of birth, ID band Patient awake    Reviewed: Allergy & Precautions, H&P , NPO status , Patient's Chart, lab work & pertinent test results, reviewed documented beta blocker date and time   Airway Mallampati: I  TM Distance: >3 FB Neck ROM: full    Dental no notable dental hx. (+) Teeth Intact, Dental Advisory Given   Pulmonary neg pulmonary ROS,    Pulmonary exam normal breath sounds clear to auscultation       Cardiovascular Exercise Tolerance: Good hypertension, Pt. on medications + Peripheral Vascular Disease   Rhythm:regular Rate:Normal     Neuro/Psych PSYCHIATRIC DISORDERS Anxiety negative neurological ROS     GI/Hepatic negative GI ROS, Neg liver ROS,   Endo/Other  Hypothyroidism Morbid obesity  Renal/GU negative Renal ROS  negative genitourinary   Musculoskeletal   Abdominal (+) + obese,   Peds  Hematology negative hematology ROS (+)   Anesthesia Other Findings   Reproductive/Obstetrics negative OB ROS                            Anesthesia Physical Anesthesia Plan  ASA: III and emergent  Anesthesia Plan: General   Post-op Pain Management:    Induction:   PONV Risk Score and Plan: 3 and Ondansetron and Dexamethasone  Airway Management Planned: Oral ETT  Additional Equipment: None  Intra-op Plan:   Post-operative Plan:   Informed Consent: I have reviewed the patients History and Physical, chart, labs and discussed the procedure including the risks, benefits and alternatives for the proposed anesthesia with the patient or authorized representative who has indicated his/her understanding and acceptance.     Dental Advisory Given  Plan Discussed with: CRNA and Anesthesiologist  Anesthesia Plan Comments:         Anesthesia Quick Evaluation

## 2020-01-09 NOTE — ED Triage Notes (Signed)
Patient c/o RLQ pain since last night with nausea.

## 2020-01-09 NOTE — Op Note (Signed)
Jill Mcintosh [id number]   PRE-OPERATIVE DIAGNOSIS:  Acute appendicitis  POST-OPERATIVE DIAGNOSIS:  Acute suppurative appendicitis with gangrene  Procedure(s): APPENDECTOMY LAPAROSCOPIC  PROCEDURE: Laparoscopic appendectomy  SURGEON:  Stephanie Coup. Danila Eddie, M.D.  ASSISTANT: OR staff  ANESTHESIA: General endotracheal  EBL:   10 mL  DRAINS: None  SPECIMEN:  Appendix  COUNTS:  Sponge, needle and instrument counts were reported correct x2 at conclusion of the operation  DISPOSITION:  PACU in satisfactory condition  COMPLICATIONS: None  FINDINGS: Acute suppurative appendicitis with purulent (pus) fluid in the right lower quadrant; gangrenous changes to the appendix without a frank disruption of the wall per se.  DESCRIPTION:   The patient was identified & brought into the operating room. SCDs were in place and functioning. General endotracheal anesthesia was administered. Preoperative antibiotics were administered. The patient was positioned supine with left arm tucked. Hair on the abdomen was then clipped by the OR team. A foley catheter was inserted under sterile conditions. The abdomen was prepped and draped in the standard sterile fashion. A surgical timeout confirmed our plan.  A small incision was made in the supraumbilical fold. The subcutaneous tissue was dissected and the umbilical stalk identified. The stalk was grasped with a Kocher and retracted outwardly. The supraumbilical fascia was exposed and incised. Peritoneal entry was carefully made bluntly. A 0 Vicryl purse-string suture was placed and then the [facility] port was introduced into the abdomen.  CO2 insufflation commenced to . The laparoscope was inserted and confirmed no evidence of trocar site complications. The patient was then positioned in Trendelenburg. Two additional ports were placed - one in left lower quadrant and another in the suprapubic midline taking care to stay well above the bladder - 3  fingerbreadths above the pubic symphysis. The bed was then slightly tilted to place the left side down.  There was purulent fluid readily identified in the right lower quadrant surrounding the appendix.  This was aspirated with the laparoscopic suction device.  The appendix had early gangrenous appearing changes to the wall.  There is no frank disruption of the wall.  The appendix was identified and attachments to the appendix to the surrounding tissues were freed without difficulty.  The appendix was elevated.  The base of the appendix was circumferentially dissected taking care to preserve the cecum free of injury. The base was noted to be viable and healthy appearing. The terminal ileum, cecum and ascending colon also appeared normal. The base of the appendix was then stapled with a blue load, taking it flush with the cecum and taking care to stay clear of the ileocecal valve. The mesoappendix was then ligated by "hugging" the appendix using the harmonic scalpel. The mesoappendix was inspected and noted to be hemostatic. The appendix was placed in an EndoBag.  The right lower quadrant was conservatively irrigated. Hemostasis was noted to be achieved - taking time to inspect the ligated mesoappendix, colon mesentery, and retroperitoneum. Staple line was noted to be intact on the cecum with no bleeding. There was no perforation or injury. The right lower quadrant appeared clean and as such, no drain was placed.  The left lower quadrant and suprapubic ports were removed under direct visualization. The EndoBag was then removed through the umbilical port site and passed off as specimen. The CO2 was exhausted from the abdomen.  The umbilical fascia was then closed with 2 separate 0 Vicryl sutures. The fascia was palpated and noted to be completely closed. The skin of all port sites was  then approximated using 4-0 Monocryl suture. The incisions were covered with Dermabond.  She was then awakened from general  anesthesia, extubated, and transferred to a stretcher for transport to recover in satisfactory condition.

## 2020-01-09 NOTE — Anesthesia Procedure Notes (Signed)
Procedure Name: Intubation Date/Time: 01/09/2020 6:34 PM Performed by: Raenette Rover, CRNA Pre-anesthesia Checklist: Patient identified, Emergency Drugs available, Suction available and Patient being monitored Patient Re-evaluated:Patient Re-evaluated prior to induction Oxygen Delivery Method: Circle system utilized Preoxygenation: Pre-oxygenation with 100% oxygen Induction Type: IV induction and Cricoid Pressure applied Ventilation: Mask ventilation without difficulty Laryngoscope Size: Mac and 3 Grade View: Grade I Tube type: Oral Tube size: 7.0 mm Number of attempts: 1 Airway Equipment and Method: Stylet Placement Confirmation: ETT inserted through vocal cords under direct vision,  positive ETCO2 and breath sounds checked- equal and bilateral Secured at: 21 cm Tube secured with: Tape Dental Injury: Teeth and Oropharynx as per pre-operative assessment

## 2020-01-09 NOTE — H&P (Signed)
CC: RLQ abdominal pain; acute appendicitis  Requesting provider: Britni Henderly PA-C  HPI: Jill Mcintosh is an 60 y.o. female with hx of Raynaud's/lupus (managed on plaquenil) presents to the emergency department today with a 1 day history of right lower quadrant abdominal pain.  She describes this as being severe, sharp, unremitting.  She has never had this kind of pain before.  The pain does not radiate.  Things that make the pain worse include movement.  By laying still she is able to help alleviate some of the discomfort.  She denies any nausea or vomiting. Temp here of 100.74F.  She denies any changes in her bowel movements including diarrhea or constipation.  She denies any blood in her stool but does report that her stools are chronically black because she takes iron.  She reports she is due this month for a follow-up colonoscopy.  She has them every 5 years for a personal history of polyps.  PSH: Laparoscopic cholecystectomy 1990s; breast reduction surgery  SHx: Retired but has gone back to work for [employer] - has worked in Investment banker, corporate. Denies use of tob/etoh/drugs.   Past Medical History:  Diagnosis Date  . Allergic rhinitis   . Anxiety   . Bradycardia   . H/O bilateral breast reduction surgery   . Hypertension   . Insomnia   . Lupus ([place])   . Mild tricuspid regurgitation   . Raynaud's syndrome   . Sicca syndrome ([place])   . Sjogren's disease ([place])   . Thyroid disease   . Vitamin D deficiency     Past Surgical History:  Procedure Laterality Date  . BREAST REDUCTION SURGERY    . CHOLECYSTECTOMY    . NASAL SINUS SURGERY    . REDUCTION MAMMAPLASTY Bilateral   . TONSILLECTOMY      Family History  Problem Relation Age of Onset  . Hyperlipidemia Mother   . Hypertension Mother   . Heart attack Father   . Hyperlipidemia Father   . Hypertension Father   . Hypertension Brother   . Diabetes Brother   . Heart disease Brother   . Hypertension  Paternal Grandmother   . Diabetes Paternal Grandmother   . Heart attack Paternal Grandfather   . Hypertension Brother     Social:  reports that she has never smoked. She has never used smokeless tobacco. She reports current alcohol use. She reports that she does not use drugs.  Allergies:  Allergies  Allergen Reactions  . Darvon [Propoxyphene] Other (See Comments)    Headache and hullicination    Medications: I have reviewed the patient's current medications.  Results for orders placed or performed during the hospital encounter of 01/09/20 (from the past 48 hour(s))  Lipase, blood     Status: None   Collection Time: 01/09/20 12:13 PM  Result Value Ref Range   Lipase 29 11 - 51 U/L    Comment: Performed at [facility], [place] [address], [place], [place] [address]  Comprehensive metabolic panel     Status: Abnormal   Collection Time: 01/09/20 12:13 PM  Result Value Ref Range   Sodium 134 (L) 135 - 145 mmol/L   Potassium 4.2 3.5 - 5.1 mmol/L   Chloride 99 98 - 111 mmol/L   CO2 24 22 - 32 mmol/L   Glucose, Bld 132 (H) 70 - 99 mg/dL    Comment: Glucose reference range applies only to samples taken after fasting for at least 8 hours.  BUN 11 6 - 20 mg/dL   Creatinine, Ser 8.14 0.44 - 1.00 mg/dL   Calcium 9.8 8.9 - 48.1 mg/dL   Total Protein 9.2 (H) 6.5 - 8.1 g/dL   Albumin 4.5 3.5 - 5.0 g/dL   AST 37 15 - 41 U/L   ALT 54 (H) 0 - 44 U/L   Alkaline Phosphatase 56 38 - 126 U/L   Total Bilirubin 0.8 0.3 - 1.2 mg/dL   GFR, Estimated >85 >63 mL/min    Comment: (NOTE) Calculated using the CKD-EPI Creatinine Equation (2021)    Anion gap 11 5 - 15    Comment: Performed at [facility], [address], [place], [place] [address]  CBC     Status: None   Collection Time: 01/09/20 12:13 PM  Result Value Ref Range   WBC 9.3 4.0 - 10.5 K/uL   RBC 4.74 3.87 - 5.11 MIL/uL   Hemoglobin 13.7 12.0 - 15.0 g/dL   HCT 26.3 78.5 - 88.5 %   MCV 86.7 80.0 -  100.0 fL   MCH 28.9 26.0 - 34.0 pg   MCHC 33.3 30.0 - 36.0 g/dL   RDW 02.7 74.1 - 28.7 %   Platelets 254 150 - 400 K/uL   nRBC 0.0 0.0 - 0.2 %    Comment: Performed at [facility], [address], [place], [place] [address]  Urinalysis, Routine w reflex microscopic Urine, Clean Catch     Status: Abnormal   Collection Time: 01/09/20 12:13 PM  Result Value Ref Range   Color, Urine YELLOW YELLOW   APPearance TURBID (A) CLEAR   Specific Gravity, Urine 1.024 1.005 - 1.030   pH 5.0 5.0 - 8.0   Glucose, UA NEGATIVE NEGATIVE mg/dL   Hgb urine dipstick NEGATIVE NEGATIVE   Bilirubin Urine NEGATIVE NEGATIVE   Ketones, ur NEGATIVE NEGATIVE mg/dL   Protein, ur NEGATIVE NEGATIVE mg/dL   Nitrite NEGATIVE NEGATIVE   Leukocytes,Ua NEGATIVE NEGATIVE   RBC / HPF 0-5 0 - 5 RBC/hpf   Bacteria, UA FEW (A) NONE SEEN   Mucus PRESENT    Amorphous Crystal PRESENT     Comment: Performed at [facility], [address], [place], [place] [address]  I-Stat beta hCG blood, ED     Status: None   Collection Time: 01/09/20 12:31 PM  Result Value Ref Range   I-stat hCG, quantitative <5.0 <5 mIU/mL   Comment 3            Comment:   GEST. AGE      CONC.  (mIU/mL)   <=1 WEEK        5 - 50     2 WEEKS       50 - 500     3 WEEKS       100 - 10,000     4 WEEKS     1,000 - 30,000        FEMALE AND NON-PREGNANT FEMALE:     LESS THAN 5 mIU/mL   Resp Panel by RT-PCR (Flu A&B, Covid) Nasopharyngeal Swab     Status: None   Collection Time: 01/09/20  4:19 PM   Specimen: Nasopharyngeal Swab; Nasopharyngeal(NP) swabs in vial transport medium  Result Value Ref Range   SARS Coronavirus 2 by RT PCR NEGATIVE NEGATIVE    Comment: (NOTE) SARS-CoV-2 target nucleic acids are NOT DETECTED.  The SARS-CoV-2 RNA is generally detectable in upper respiratory specimens during the acute phase of infection.  The lowest concentration of SARS-CoV-2 viral copies this assay can detect is 138  copies/mL. A negative result does not preclude SARS-Cov-2 infection and should not be used as the sole basis for treatment or other patient management decisions. A negative result may occur with  improper specimen collection/handling, submission of specimen other than nasopharyngeal swab, presence of viral mutation(s) within the areas targeted by this assay, and inadequate number of viral copies(<138 copies/mL). A negative result must be combined with clinical observations, patient history, and epidemiological information. The expected result is Negative.  Fact Sheet for Patients:  BloggerCourse.com  Fact Sheet for Healthcare Providers:  SeriousBroker.it  This test is no t yet approved or cleared by the [place] FDA and  has been authorized for detection and/or diagnosis of SARS-CoV-2 by FDA under an Emergency Use Authorization (EUA). This EUA will remain  in effect (meaning this test can be used) for the duration of the COVID-19 declaration under Section 564(b)(1) of the Act, 21 U.S.C.section 360bbb-3(b)(1), unless the authorization is terminated  or revoked sooner.       Influenza A by PCR NEGATIVE NEGATIVE   Influenza B by PCR NEGATIVE NEGATIVE    Comment: (NOTE) The Xpert Xpress SARS-CoV-2/FLU/RSV plus assay is intended as an aid in the diagnosis of influenza from Nasopharyngeal swab specimens and should not be used as a sole basis for treatment. Nasal washings and aspirates are unacceptable for Xpert Xpress SARS-CoV-2/FLU/RSV testing.  Fact Sheet for Patients: BloggerCourse.com  Fact Sheet for Healthcare Providers: SeriousBroker.it  This test is not yet approved or cleared by the [place] FDA and has been authorized for detection and/or diagnosis of SARS-CoV-2 by FDA under an Emergency Use Authorization (EUA). This EUA will remain in effect (meaning this test  can be used) for the duration of the COVID-19 declaration under Section 564(b)(1) of the Act, 21 U.S.C. section 360bbb-3(b)(1), unless the authorization is terminated or revoked.  Performed at [facility], [address] [place], [place] [address]     CT Abdomen Pelvis W Contrast  Result Date: 01/09/2020 CLINICAL DATA:  Right lower quadrant pain EXAM: CT ABDOMEN AND PELVIS WITH CONTRAST TECHNIQUE: Multidetector CT imaging of the abdomen and pelvis was performed using the standard protocol following bolus administration of intravenous contrast. CONTRAST:  OMNIPAQUE IOHEXOL 300 MG/ML  SOLN COMPARISON:  10/27/2014 FINDINGS: Lower chest: No acute abnormality. Hepatobiliary: Prior cholecystectomy. Diffuse fatty infiltration of the liver. No focal hepatic abnormality. Pancreas: No focal abnormality or ductal dilatation. Spleen: No focal abnormality.  Normal size. Adrenals/Urinary Tract: No adrenal abnormality. No focal renal abnormality. No stones or hydronephrosis. Urinary bladder is unremarkable. Stomach/Bowel: Dilated, inflamed appendix compatible with acute appendicitis. Appendix measures up to 10 mm. Scattered sigmoid diverticulosis. Stomach and small bowel decompressed, unremarkable. Vascular/Lymphatic: Scattered aortic atherosclerosis. No evidence of aneurysm or adenopathy. Reproductive: Uterus and adnexa unremarkable.  No mass. Other: No free fluid or free air. Musculoskeletal: No acute bony abnormality. IMPRESSION: Dilated, inflamed appendix compatible with acute appendicitis. Hepatic steatosis. These results were called by telephone at the time of interpretation on 01/09/2020 at 4:16 pm to provider [facility] , who verbally acknowledged these results. Electronically Signed   By: Charlett Nose M.D.   On: 01/09/2020 16:18    ROS - all of the below systems have been reviewed with the patient and positives are indicated with bold text General: chills, fever or night  sweats Eyes: blurry vision or double vision ENT: epistaxis or sore throat Allergy/Immunology: itchy/watery eyes  or nasal congestion Hematologic/Lymphatic: bleeding problems, blood clots or swollen lymph nodes Endocrine: temperature intolerance or unexpected weight changes Breast: new or changing breast lumps or nipple discharge Resp: cough, shortness of breath, or wheezing CV: chest pain or dyspnea on exertion GI: as per HPI GU: dysuria, trouble voiding, or hematuria MSK: joint pain or joint stiffness Neuro: TIA or stroke symptoms Derm: pruritus and skin lesion changes Psych: anxiety and depression  PE Blood pressure 123/68, pulse 99, temperature (!) 100.9 F (38.3 C), resp. rate 18, SpO2 96 %. Constitutional: NAD; conversant; no deformities Eyes: Moist conjunctiva; no lid lag; anicteric; PERRL Neck: Trachea midline; no thyromegaly Lungs: Normal respiratory effort; no tactile fremitus CV: RRR; no palpable thrills; no pitting edema GI: Abd obese, soft, focally ttp in RLQ with regional guarding; no rebound nor guarding elsewhere; nondistended; no palpable hepatosplenomegaly MSK: Normal range of motion of extremities; no clubbing/cyanosis Psychiatric: Appropriate affect; alert and oriented x3 Lymphatic: No palpable cervical or axillary lymphadenopathy  Results for orders placed or performed during the hospital encounter of 01/09/20 (from the past 48 hour(s))  Lipase, blood     Status: None   Collection Time: 01/09/20 12:13 PM  Result Value Ref Range   Lipase 29 11 - 51 U/L    Comment: Performed at [facility], [address], [place], [place] [address]  Comprehensive metabolic panel     Status: Abnormal   Collection Time: 01/09/20 12:13 PM  Result Value Ref Range   Sodium 134 (L) 135 - 145 mmol/L   Potassium 4.2 3.5 - 5.1 mmol/L   Chloride 99 98 - 111 mmol/L   CO2 24 22 - 32 mmol/L   Glucose, Bld 132 (H) 70 - 99 mg/dL    Comment: Glucose reference range  applies only to samples taken after fasting for at least 8 hours.   BUN 11 6 - 20 mg/dL   Creatinine, Ser 2.72 0.44 - 1.00 mg/dL   Calcium 9.8 8.9 - 53.6 mg/dL   Total Protein 9.2 (H) 6.5 - 8.1 g/dL   Albumin 4.5 3.5 - 5.0 g/dL   AST 37 15 - 41 U/L   ALT 54 (H) 0 - 44 U/L   Alkaline Phosphatase 56 38 - 126 U/L   Total Bilirubin 0.8 0.3 - 1.2 mg/dL   GFR, Estimated >64 >40 mL/min    Comment: (NOTE) Calculated using the CKD-EPI Creatinine Equation (2021)    Anion gap 11 5 - 15    Comment: Performed at [facility], [address], [place], [place] [address]  CBC     Status: None   Collection Time: 01/09/20 12:13 PM  Result Value Ref Range   WBC 9.3 4.0 - 10.5 K/uL   RBC 4.74 3.87 - 5.11 MIL/uL   Hemoglobin 13.7 12.0 - 15.0 g/dL   HCT 59.5 63.8 - 75.6 %   MCV 86.7 80.0 - 100.0 fL   MCH 28.9 26.0 - 34.0 pg   MCHC 33.3 30.0 - 36.0 g/dL   RDW 43.3 29.5 - 18.8 %   Platelets 254 150 - 400 K/uL   nRBC 0.0 0.0 - 0.2 %    Comment: Performed at [facility], [address], [place], [place] [address]  Urinalysis, Routine w reflex microscopic Urine, Clean Catch     Status: Abnormal   Collection Time: 01/09/20 12:13 PM  Result Value Ref Range   Color, Urine YELLOW YELLOW   APPearance TURBID (A) CLEAR   Specific Gravity,  Urine 1.024 1.005 - 1.030   pH 5.0 5.0 - 8.0   Glucose, UA NEGATIVE NEGATIVE mg/dL   Hgb urine dipstick NEGATIVE NEGATIVE   Bilirubin Urine NEGATIVE NEGATIVE   Ketones, ur NEGATIVE NEGATIVE mg/dL   Protein, ur NEGATIVE NEGATIVE mg/dL   Nitrite NEGATIVE NEGATIVE   Leukocytes,Ua NEGATIVE NEGATIVE   RBC / HPF 0-5 0 - 5 RBC/hpf   Bacteria, UA FEW (A) NONE SEEN   Mucus PRESENT    Amorphous Crystal PRESENT     Comment: Performed at [facility], [address] [place], [place] [address]  I-Stat beta hCG blood, ED     Status: None   Collection Time: 01/09/20 12:31 PM  Result Value Ref Range   I-stat hCG,  quantitative <5.0 <5 mIU/mL   Comment 3            Comment:   GEST. AGE      CONC.  (mIU/mL)   <=1 WEEK        5 - 50     2 WEEKS       50 - 500     3 WEEKS       100 - 10,000     4 WEEKS     1,000 - 30,000        FEMALE AND NON-PREGNANT FEMALE:     LESS THAN 5 mIU/mL   Resp Panel by RT-PCR (Flu A&B, Covid) Nasopharyngeal Swab     Status: None   Collection Time: 01/09/20  4:19 PM   Specimen: Nasopharyngeal Swab; Nasopharyngeal(NP) swabs in vial transport medium  Result Value Ref Range   SARS Coronavirus 2 by RT PCR NEGATIVE NEGATIVE    Comment: (NOTE) SARS-CoV-2 target nucleic acids are NOT DETECTED.  The SARS-CoV-2 RNA is generally detectable in upper respiratory specimens during the acute phase of infection. The lowest concentration of SARS-CoV-2 viral copies this assay can detect is 138 copies/mL. A negative result does not preclude SARS-Cov-2 infection and should not be used as the sole basis for treatment or other patient management decisions. A negative result may occur with  improper specimen collection/handling, submission of specimen other than nasopharyngeal swab, presence of viral mutation(s) within the areas targeted by this assay, and inadequate number of viral copies(<138 copies/mL). A negative result must be combined with clinical observations, patient history, and epidemiological information. The expected result is Negative.  Fact Sheet for Patients:  BloggerCourse.com  Fact Sheet for Healthcare Providers:  SeriousBroker.it  This test is no t yet approved or cleared by the [place] FDA and  has been authorized for detection and/or diagnosis of SARS-CoV-2 by FDA under an Emergency Use Authorization (EUA). This EUA will remain  in effect (meaning this test can be used) for the duration of the COVID-19 declaration under Section 564(b)(1) of the Act, 21 U.S.C.section 360bbb-3(b)(1), unless the authorization is  terminated  or revoked sooner.       Influenza A by PCR NEGATIVE NEGATIVE   Influenza B by PCR NEGATIVE NEGATIVE    Comment: (NOTE) The Xpert Xpress SARS-CoV-2/FLU/RSV plus assay is intended as an aid in the diagnosis of influenza from Nasopharyngeal swab specimens and should not be used as a sole basis for treatment. Nasal washings and aspirates are unacceptable for Xpert Xpress SARS-CoV-2/FLU/RSV testing.  Fact Sheet for Patients: BloggerCourse.com  Fact Sheet for Healthcare Providers: SeriousBroker.it  This test is not yet approved or cleared by the [place] FDA and has been authorized for detection and/or  diagnosis of SARS-CoV-2 by FDA under an Emergency Use Authorization (EUA). This EUA will remain in effect (meaning this test can be used) for the duration of the COVID-19 declaration under Section 564(b)(1) of the Act, 21 U.S.C. section 360bbb-3(b)(1), unless the authorization is terminated or revoked.  Performed at [facility], [address] [place], [place] [id number]     CT Abdomen Pelvis W Contrast  Result Date: 01/09/2020 CLINICAL DATA:  Right lower quadrant pain EXAM: CT ABDOMEN AND PELVIS WITH CONTRAST TECHNIQUE: Multidetector CT imaging of the abdomen and pelvis was performed using the standard protocol following bolus administration of intravenous contrast. CONTRAST:  100mL OMNIPAQUE IOHEXOL 300 MG/ML  SOLN COMPARISON:  10/27/2014 FINDINGS: Lower chest: No acute abnormality. Hepatobiliary: Prior cholecystectomy. Diffuse fatty infiltration of the liver. No focal hepatic abnormality. Pancreas: No focal abnormality or ductal dilatation. Spleen: No focal abnormality.  Normal size. Adrenals/Urinary Tract: No adrenal abnormality. No focal renal abnormality. No stones or hydronephrosis. Urinary bladder is unremarkable. Stomach/Bowel: Dilated, inflamed appendix compatible with acute appendicitis.  Appendix measures up to 10 mm. Scattered sigmoid diverticulosis. Stomach and small bowel decompressed, unremarkable. Vascular/Lymphatic: Scattered aortic atherosclerosis. No evidence of aneurysm or adenopathy. Reproductive: Uterus and adnexa unremarkable.  No mass. Other: No free fluid or free air. Musculoskeletal: No acute bony abnormality. IMPRESSION: Dilated, inflamed appendix compatible with acute appendicitis. Hepatic steatosis. These results were called by telephone at the time of interpretation on 01/09/2020 at 4:16 pm to provider [facility] , who verbally acknowledged these results. Electronically Signed   By: Charlett NoseKevin  Dover M.D.   On: 01/09/2020 16:18   I have personally reviewed her CT scan as well - dilated appendix with periappendiceal inflammation and stranding   A/P: Jill Mcintosh is an 60 y.o. female with hx of lupus - here with acute appendicitis without CT evidence of perforation or abscess  -The anatomy and physiology of the GI tract was discussed with the patient. The pathophysiology of appendicitis was discussed as well. -We reviewed options moving forward for treatment, covering IV abx vs surgery. We discussed that with antibiotics alone, there is reasonable success in managing appendicitis, however, risks of recurrence at 6556yrs being as high as 40% in some studies. We discussed appendectomy - laparoscopic and potential open techniques as well as scenarios where an ileocecectomy could be necessary. We discussed the material risks (including, but not limited to, pain, bleeding, infection, scarring, need for blood transfusion, damage to surrounding structures- blood vessels/nerves/viscus/organs, damage to ureter/bladder, urine leak, leak from staple line, need for additional procedures, hernia, recurrence although quite low with surgery, pneumonia, heart attack, stroke, death) benefits and alternatives to surgery were discussed. The patient's questions were answered to her  satisfaction, she voiced understanding and has elected to proceed with surgery. Additionally, we discussed typical postoperative expectations and the recovery process. -She requested I update her husband via phone after surgery  Marin Olphristopher Aleaha Fickling, MD [facility] Use [email or web address] to contact on call provider

## 2020-01-09 NOTE — ED Provider Notes (Signed)
[facility]-EMERGENCY DEPT Provider Note   CSN: [id number] Arrival date & time: 01/09/20  1049    History Chief Complaint  Patient presents with  . Abdominal Pain    Jill Mcintosh is a 60 y.o. female with past medical history significant for lupus, hypertension who presents for evaluation of right lower quadrant pain.  Began yesterday.  Has associated nausea, no emesis.  Had chills starting this morning her is not taken her temperature.  She denies headache, lightheadedness, dizziness, chest pain, shortness of breath, dysuria, diarrhea, constipation.  No paresthesias or weakness.  Denies additional aggravating or alleviating factors.  History pain from patient and past medical records.  No interpreter used   Prior Cholecystectomy  Last PO  Intake 9 AM this morning gatorade  COVID vaccinated 02/2019, no booster, No sick contacts  HPI     Past Medical History:  Diagnosis Date  . Allergic rhinitis   . Anxiety   . Bradycardia   . H/O bilateral breast reduction surgery   . Hypertension   . Insomnia   . Lupus (HCC)   . Mild tricuspid regurgitation   . Raynaud's syndrome   . Sicca syndrome (HCC)   . Sjogren's disease (HCC)   . Thyroid disease   . Vitamin D deficiency     Patient Active Problem List   Diagnosis Date Noted  . Pain in left shoulder 02/04/2019  . Bradycardia 06/07/2014  . Benign essential HTN 06/07/2014  . Lupus (systemic lupus erythematosus) (HCC) 06/07/2014    Past Surgical History:  Procedure Laterality Date  . BREAST REDUCTION SURGERY    . CHOLECYSTECTOMY    . NASAL SINUS SURGERY    . REDUCTION MAMMAPLASTY Bilateral   . TONSILLECTOMY       OB History   No obstetric history on file.     Family History  Problem Relation Age of Onset  . Hyperlipidemia Mother   . Hypertension Mother   . Heart attack Father   . Hyperlipidemia Father   . Hypertension Father   . Hypertension Brother   . Diabetes Brother   . Heart  disease Brother   . Hypertension Paternal Grandmother   . Diabetes Paternal Grandmother   . Heart attack Paternal Grandfather   . Hypertension Brother     Social History   Tobacco Use  . Smoking status: Never Smoker  . Smokeless tobacco: Never Used  Substance Use Topics  . Alcohol use: Yes    Alcohol/week: 0.0 standard drinks    Comment: rarely  . Drug use: No    Home Medications Prior to Admission medications   Medication Sig Start Date End Date Taking? Authorizing Provider  hydroxychloroquine (PLAQUENIL) 200 MG tablet Take by mouth.   Yes [provider]  levothyroxine (SYNTHROID) 112 MCG tablet Take 112 mcg by mouth daily before breakfast.   Yes [provider]  lisinopril-hydrochlorothiazide (PRINZIDE,ZESTORETIC) 10-12.5 MG per tablet Take 1 tablet by mouth daily.   Yes [provider]  pramipexole (MIRAPEX) 1 MG tablet Take 1 mg by mouth at bedtime. 10/29/18  Yes [provider]  pravastatin (PRAVACHOL) 20 MG tablet Take 20 mg by mouth daily.   Yes [provider]    Allergies    Darvon [propoxyphene]  Review of Systems   Review of Systems  Constitutional: Positive for fever.  HENT: Negative.   Respiratory: Negative.   Cardiovascular: Negative.   Gastrointestinal: Positive for abdominal pain (RLQ) and nausea. Negative for abdominal distention, anal bleeding,  blood in stool, constipation, diarrhea, rectal pain and vomiting.  Genitourinary: Negative.   Musculoskeletal: Negative.   Skin: Negative.   Neurological: Negative.   All other systems reviewed and are negative.   Physical Exam Updated Vital Signs BP 123/68 (BP Location: Left Arm)   Pulse 99   Temp (!) 100.9 F (38.3 C)   Resp 18   SpO2 96%   Physical Exam Vitals and nursing note reviewed.  Constitutional:      General: She is not in acute distress.    Appearance: She is well-developed and well-nourished. She is ill-appearing. She is not toxic-appearing  or diaphoretic.  HENT:     Head: Normocephalic and atraumatic.     Mouth/Throat:     Mouth: Mucous membranes are moist.  Eyes:     Pupils: Pupils are equal, round, and reactive to light.  Cardiovascular:     Rate and Rhythm: Normal rate.     Pulses: Intact distal pulses.     Heart sounds: Normal heart sounds.  Pulmonary:     Effort: Pulmonary effort is normal. No respiratory distress.     Breath sounds: Normal breath sounds.  Abdominal:     General: Bowel sounds are normal. There is no distension.     Palpations: Abdomen is soft.     Tenderness: There is abdominal tenderness in the right lower quadrant. Positive signs include McBurney's sign.     Hernia: No hernia is present.  Musculoskeletal:        General: Normal range of motion.     Cervical back: Normal range of motion.  Skin:    General: Skin is warm and dry.     Capillary Refill: Capillary refill takes less than 2 seconds.  Neurological:     General: No focal deficit present.     Mental Status: She is alert and oriented to person, place, and time.  Psychiatric:        Mood and Affect: Mood and affect normal.    ED Results / Procedures / Treatments   Labs (all labs ordered are listed, but only abnormal results are displayed) Labs Reviewed  COMPREHENSIVE METABOLIC PANEL - Abnormal; Notable for the following components:      Result Value   Sodium 134 (*)    Glucose, Bld 132 (*)    Total Protein 9.2 (*)    ALT 54 (*)    All other components within normal limits  URINALYSIS, ROUTINE W REFLEX MICROSCOPIC - Abnormal; Notable for the following components:   APPearance TURBID (*)    Bacteria, UA FEW (*)    All other components within normal limits  RESP PANEL BY RT-PCR (FLU A&B, COVID) ARPGX2  LIPASE, BLOOD  CBC  I-STAT BETA HCG BLOOD, ED ([facility], [facility], [facility] ONLY)    EKG None  Radiology CT Abdomen Pelvis W Contrast  Result Date: 01/09/2020 CLINICAL DATA:  Right lower quadrant pain EXAM: CT ABDOMEN AND PELVIS WITH  CONTRAST TECHNIQUE: Multidetector CT imaging of the abdomen and pelvis was performed using the standard protocol following bolus administration of intravenous contrast. CONTRAST:  116mL OMNIPAQUE IOHEXOL 300 MG/ML  SOLN COMPARISON:  10/27/2014 FINDINGS: Lower chest: No acute abnormality. Hepatobiliary: Prior cholecystectomy. Diffuse fatty infiltration of the liver. No focal hepatic abnormality. Pancreas: No focal abnormality or ductal dilatation. Spleen: No focal abnormality.  Normal size. Adrenals/Urinary Tract: No adrenal abnormality. No focal renal abnormality. No stones or hydronephrosis. Urinary bladder is unremarkable. Stomach/Bowel: Dilated, inflamed appendix compatible with acute appendicitis. Appendix measures up  to 10 mm. Scattered sigmoid diverticulosis. Stomach and small bowel decompressed, unremarkable. Vascular/Lymphatic: Scattered aortic atherosclerosis. No evidence of aneurysm or adenopathy. Reproductive: Uterus and adnexa unremarkable.  No mass. Other: No free fluid or free air. Musculoskeletal: No acute bony abnormality. IMPRESSION: Dilated, inflamed appendix compatible with acute appendicitis. Hepatic steatosis. These results were called by telephone at the time of interpretation on 01/09/2020 at 4:16 pm to provider [facility] , who verbally acknowledged these results. Electronically Signed   By: Charlett Nose M.D.   On: 01/09/2020 16:18    Procedures Procedures (including critical care time)  Medications Ordered in ED Medications  bupivacaine-epinephrine (MARCAINE W/ EPI) 0.25% -1:200000 injection (50 mLs Infiltration Given 01/09/20 1837)  lactated ringers irrigation solution (1,000 mLs Irrigation Given 01/09/20 1838)  iohexol (OMNIPAQUE) 300 MG/ML solution 100 mL (100 mLs Intravenous Contrast Given 01/09/20 1556)  piperacillin-tazobactam (ZOSYN) IVPB 3.375 g (3.375 g Intravenous New Bag/Given 01/09/20 1702)  morphine 4 MG/ML injection 4 mg (4 mg Intravenous Given 01/09/20 1659)  ondansetron  (ZOFRAN) injection 4 mg (4 mg Intravenous Given 01/09/20 1659)  acetaminophen (TYLENOL) tablet 650 mg (650 mg Oral Given 01/09/20 1659)   ED Course  I have reviewed the triage vital signs and the nursing notes.  Pertinent labs & imaging results that were available during my care of the patient were reviewed by me and considered in my medical decision making (see chart for details).   60 year old appears otherwise well presents for evaluation of right lower quadrant pain and fever.  Began yesterday evening.  On arrival she is febrile however no tachycardia, tachypnea or hypoxia.  She does not appear septic.  Moderate tenderness to right lower quadrant with positive McBurney sign. Declines concerns for STDs. No urinary complaints.  Labs and imaging personally reviewed and interpreted:  CBC without leukocytosis CMP with Sodium at 134, glucose 132, ALT 54 however no additional electrolyte, renal or liver abnormality UA negative for infection Preg negative Lipase 29  COVID negative  CT AP POSITIVE for acute appendicitis  CONSULT with Dr. Cliffton Asters with General surgery who will evaluate patient for admission.  The patient appears reasonably stabilized for admission considering the current resources, flow, and capabilities available in the ED at this time, and I doubt any other [facility] requiring further screening and/or treatment in the ED prior to admission.    MDM Rules/Calculators/A&P                           Final Clinical Impression(s) / ED Diagnoses Final diagnoses:  Acute appendicitis, unspecified acute appendicitis type    Rx / DC Orders ED Discharge Orders    None       Shazia Mitchener A, PA-C 01/09/20 1840    Margarita Grizzle, MD 01/09/20 1948

## 2020-01-09 NOTE — Progress Notes (Signed)
Pharmacy Antibiotic Note  Jill Mcintosh is a 60 y.o. female admitted on 01/09/2020 with Intra-abdominal infection; Acute suppurative appendicitis with gangrene s/p lap appendectomy 01/09/19.  Pharmacy has been consulted for Zosyn dosing.  Plan: Zosyn 3.375g IV Q8H infused over 4hrs.      Temp (24hrs), Avg:99.8 F (37.7 C), Min:99 F (37.2 C), Max:100.9 F (38.3 C)  Recent Labs  Lab 01/09/20 1213  WBC 9.3  CREATININE 0.83    CrCl cannot be calculated (Unknown ideal weight.).    Allergies  Allergen Reactions  . Darvon [Propoxyphene] Other (See Comments)    Headache and hullicination    Antimicrobials this admission: 01/09/19 Zosyn >>   Dose adjustments this admission:   Microbiology results:   Thank you for allowing pharmacy to be a part of this patient's care.  Lynann Beaver PharmD, BCPS Clinical Pharmacist [facility] main pharmacy [phone] 01/09/2020 9:16 PM

## 2020-01-10 ENCOUNTER — Encounter (HOSPITAL_COMMUNITY): Payer: Self-pay | Admitting: Surgery

## 2020-01-10 MED ORDER — ACETAMINOPHEN 500 MG PO TABS: 1000.0000 mg | ORAL_TABLET | Freq: Three times a day (TID) | ORAL | Status: AC | PRN

## 2020-01-10 MED ORDER — TRAMADOL HCL 50 MG PO TABS: 50.0000 mg | ORAL_TABLET | Freq: Four times a day (QID) | ORAL | 0 refills | Status: DC | PRN

## 2020-01-10 MED ORDER — AMOXICILLIN-POT CLAVULANATE 875-125 MG PO TABS: 1.0000 | ORAL_TABLET | Freq: Two times a day (BID) | ORAL | 0 refills | Status: AC

## 2020-01-10 MED ORDER — IBUPROFEN 200 MG PO TABS: 600.0000 mg | ORAL_TABLET | Freq: Four times a day (QID) | ORAL | Status: DC | PRN

## 2020-01-10 NOTE — Discharge Instructions (Signed)
[facility] LAPAROSCOPIC SURGERY: POST OP INSTRUCTIONS Always review your discharge instruction sheet given to you by the facility where your surgery was performed. IF YOU HAVE DISABILITY OR FAMILY LEAVE FORMS, YOU MUST BRING THEM TO THE OFFICE FOR PROCESSING.   DO NOT GIVE THEM TO YOUR DOCTOR.  PAIN CONTROL  1. First take acetaminophen (Tylenol) AND/or ibuprofen (Advil) to control your pain after surgery.  Follow directions on package.  Taking acetaminophen (Tylenol) and/or ibuprofen (Advil) regularly after surgery will help to control your pain and lower the amount of prescription pain medication you may need.  You should not take more than 3,000 mg (3 grams) of acetaminophen (Tylenol) in 24 hours.  You should not take ibuprofen (Advil), aleve, motrin, naprosyn or other NSAIDS if you have a history of stomach ulcers or chronic kidney disease.  2. A prescription for pain medication may be given to you upon discharge.  Take your pain medication as prescribed, if you still have uncontrolled pain after taking acetaminophen (Tylenol) or ibuprofen (Advil). 3. Use ice packs to help control pain. 4. If you need a refill on your pain medication, please contact your pharmacy.  They will contact our office to request authorization. Prescriptions will not be filled after 5pm or on week-ends.  HOME MEDICATIONS 5. Take your usually prescribed medications unless otherwise directed.  DIET 6. You should follow a light diet the first few days after arrival home.  Be sure to include lots of fluids daily. Avoid fatty, fried foods.   CONSTIPATION 7. It is common to experience some constipation after surgery and if you are taking pain medication.  Increasing fluid intake and taking a stool softener (such as Colace) will usually help or prevent this problem from occurring.  A mild laxative (Milk of Magnesia or Miralax) should be taken according to package instructions if there are no bowel  movements after 48 hours.  WOUND/INCISION CARE 8. Most patients will experience some swelling and bruising in the area of the incisions.  Ice packs will help.  Swelling and bruising can take several days to resolve.  9. Unless discharge instructions indicate otherwise, follow guidelines below  a. STERI-STRIPS - you may remove your outer bandages 48 hours after surgery, and you may shower at that time.  You have steri-strips (small skin tapes) in place directly over the incision.  These strips should be left on the skin for 7-10 days.   b. DERMABOND/SKIN GLUE - you may shower in 24 hours.  The glue will flake off over the next 2-3 weeks. 10. Any sutures or staples will be removed at the office during your follow-up visit.  ACTIVITIES 11. You may resume regular (light) daily activities beginning the next day--such as daily self-care, walking, climbing stairs--gradually increasing activities as tolerated.  You may have sexual intercourse when it is comfortable.  Refrain from any heavy lifting or straining until approved by your doctor. a. You may drive when you are no longer taking prescription pain medication, you can comfortably wear a seatbelt, and you can safely maneuver your car and apply brakes.  FOLLOW-UP 12. You should see your doctor in the office for a follow-up appointment approximately 2-3 weeks after your surgery.  You should have been given your post-op/follow-up appointment when your surgery was scheduled.  If you did not receive a post-op/follow-up appointment, make sure that you call for this appointment within a day or two after you arrive home to insure a convenient appointment time.     WHEN TO CALL YOUR DOCTOR: 1. Fever over 101.0 2. Inability to urinate 3. Continued bleeding from incision. 4. Increased pain, redness, or drainage from the incision. 5. Increasing abdominal pain  The clinic staff is available to answer your questions during regular business hours.  Please don't  hesitate to call and ask to speak to one of the nurses for clinical concerns.  If you have a medical emergency, go to the nearest emergency room or call 911.  A surgeon from [facility] is always on call at the hospital. [address], [address], [place], [place]  [address] ? [address] [address], [place], [place]   [address] [phone] ? [phone] ? FAX [phone] Web site: [email or web address]  .........   Managing Your Pain After Surgery Without Opioids    Thank you for participating in our program to help patients manage their pain after surgery without opioids. This is part of our effort to provide you with the best care possible, without exposing you or your family to the risk that opioids pose.  What pain can I expect after surgery? You can expect to have some pain after surgery. This is normal. The pain is typically worse the day after surgery, and quickly begins to get better. Many studies have found that many patients are able to manage their pain after surgery with Over-the-Counter (OTC) medications such as Tylenol and Motrin. If you have a condition that does not allow you to take Tylenol or Motrin, notify your surgical team.  How will I manage my pain? The best strategy for controlling your pain after surgery is around the clock pain control with Tylenol (acetaminophen) and Motrin (ibuprofen or Advil). Alternating these medications with each other allows you to maximize your pain control. In addition to Tylenol and Motrin, you can use heating pads or ice packs on your incisions to help reduce your pain.  How will I alternate your regular strength over-the-counter pain medication? You will take a dose of pain medication every three hours. ; Start by taking 650 mg of Tylenol (2 pills of 325 mg) ; 3 hours later take 600 mg of Motrin (3 pills of 200 mg) ; 3 hours after taking the Motrin take 650 mg of Tylenol ; 3 hours after that take 600 mg of  Motrin.   - 1 -  See example - if your first dose of Tylenol is at 12:00 PM   12:00 PM Tylenol 650 mg (2 pills of 325 mg)  3:00 PM Motrin 600 mg (3 pills of 200 mg)  6:00 PM Tylenol 650 mg (2 pills of 325 mg)  9:00 PM Motrin 600 mg (3 pills of 200 mg)  Continue alternating every 3 hours   We recommend that you follow this schedule around-the-clock for at least 3 days after surgery, or until you feel that it is no longer needed. Use the table on the last page of this handout to keep track of the medications you are taking. Important: Do not take more than 3000mg of Tylenol or 3200mg of Motrin in a 24-hour period. Do not take ibuprofen/Motrin if you have a history of bleeding stomach ulcers, severe kidney disease, &/or actively taking a blood thinner  What if I still have pain? If you have pain that is not controlled with the over-the-counter pain medications (Tylenol and Motrin or Advil) you might have what we call "breakthrough" pain. You will receive a prescription for a small amount of an opioid pain medication such as   Oxycodone, Tramadol, or Tylenol with Codeine. Use these opioid pills in the first 24 hours after surgery if you have breakthrough pain. Do not take more than 1 pill every 4-6 hours.  If you still have uncontrolled pain after using all opioid pills, don't hesitate to call our staff using the number provided. We will help make sure you are managing your pain in the best way possible, and if necessary, we can provide a prescription for additional pain medication.   Day 1    Time  Name of Medication Number of pills taken  Amount of Acetaminophen  Pain Level   Comments  AM PM       AM PM       AM PM       AM PM       AM PM       AM PM       AM PM       AM PM       Total Daily amount of Acetaminophen Do not take more than  3,000 mg per day      Day 2    Time  Name of Medication Number of pills taken  Amount of Acetaminophen  Pain Level   Comments  AM  PM       AM PM       AM PM       AM PM       AM PM       AM PM       AM PM       AM PM       Total Daily amount of Acetaminophen Do not take more than  3,000 mg per day      Day 3    Time  Name of Medication Number of pills taken  Amount of Acetaminophen  Pain Level   Comments  AM PM       AM PM       AM PM       AM PM          AM PM       AM PM       AM PM       AM PM       Total Daily amount of Acetaminophen Do not take more than  3,000 mg per day      Day 4    Time  Name of Medication Number of pills taken  Amount of Acetaminophen  Pain Level   Comments  AM PM       AM PM       AM PM       AM PM       AM PM       AM PM       AM PM       AM PM       Total Daily amount of Acetaminophen Do not take more than  3,000 mg per day      Day 5    Time  Name of Medication Number of pills taken  Amount of Acetaminophen  Pain Level   Comments  AM PM       AM PM       AM PM       AM PM       AM PM       AM PM       AM PM         AM PM       Total Daily amount of Acetaminophen Do not take more than  3,000 mg per day       Day 6    Time  Name of Medication Number of pills taken  Amount of Acetaminophen  Pain Level  Comments  AM PM       AM PM       AM PM       AM PM       AM PM       AM PM       AM PM       AM PM       Total Daily amount of Acetaminophen Do not take more than  3,000 mg per day      Day 7    Time  Name of Medication Number of pills taken  Amount of Acetaminophen  Pain Level   Comments  AM PM       AM PM       AM PM       AM PM       AM PM       AM PM       AM PM       AM PM       Total Daily amount of Acetaminophen Do not take more than  3,000 mg per day        For additional information about how and where to safely dispose of unused opioid medications - https://www.morepowerfulnc.org  Disclaimer: This document contains information and/or instructional materials adapted from [facility]  for the typical patient with your condition. It does not replace medical advice from your health care provider because your experience may differ from that of the typical patient. Talk to your health care provider if you have any questions about this document, your condition or your treatment plan. Adapted from [facility]  

## 2020-01-10 NOTE — Discharge Summary (Signed)
[facility] Discharge Summary   Patient ID: Jill Mcintosh MRN: [id number] DOB/AGE: [birth year] 60 y.o.  Admit date: 01/09/2020 Discharge date: 01/10/2020  Admitting Diagnosis: Acute appendicitis  Discharge Diagnosis S/p laparoscopic appendectomy   Consultants None   Imaging: CT Abdomen Pelvis W Contrast  Result Date: 01/09/2020 CLINICAL DATA:  Right lower quadrant pain EXAM: CT ABDOMEN AND PELVIS WITH CONTRAST TECHNIQUE: Multidetector CT imaging of the abdomen and pelvis was performed using the standard protocol following bolus administration of intravenous contrast. CONTRAST:  OMNIPAQUE IOHEXOL 300 MG/ML  SOLN COMPARISON:  10/27/2014 FINDINGS: Lower chest: No acute abnormality. Hepatobiliary: Prior cholecystectomy. Diffuse fatty infiltration of the liver. No focal hepatic abnormality. Pancreas: No focal abnormality or ductal dilatation. Spleen: No focal abnormality.  Normal size. Adrenals/Urinary Tract: No adrenal abnormality. No focal renal abnormality. No stones or hydronephrosis. Urinary bladder is unremarkable. Stomach/Bowel: Dilated, inflamed appendix compatible with acute appendicitis. Appendix measures up to 10 mm. Scattered sigmoid diverticulosis. Stomach and small bowel decompressed, unremarkable. Vascular/Lymphatic: Scattered aortic atherosclerosis. No evidence of aneurysm or adenopathy. Reproductive: Uterus and adnexa unremarkable.  No mass. Other: No free fluid or free air. Musculoskeletal: No acute bony abnormality. IMPRESSION: Dilated, inflamed appendix compatible with acute appendicitis. Hepatic steatosis. These results were called by telephone at the time of interpretation on 01/09/2020 at 4:16 pm to provider Jill Mcintosh , who verbally acknowledged these results. Electronically Signed   By: Charlett Nose M.D.   On: 01/09/2020 16:18    Procedures Dr. Marin Olp (01/09/20) - Laparoscopic Appendectomy  Mcintosh Course:  Patient is a 60 year old female  who presented to [name] with abdominal pain.  Workup showed acute appendicitis.  Patient was admitted and underwent procedure listed above.  Tolerated procedure well and was transferred to the floor.  Diet was advanced as tolerated.  On POD#1, the patient was voiding well, tolerating diet, ambulating well, pain well controlled, vital signs stable, incisions c/d/i and felt stable for discharge home.  Patient will follow up in our office in 3 weeks and knows to call with questions or concerns. She will call to confirm appointment date/time.    Physical Exam: General:  Alert, NAD, pleasant, comfortable Abd:  Soft, ND, mild tenderness, incisions C/D/I  I or a member of my team have reviewed this patient in the Controlled Substance Database.   Allergies as of 01/10/2020      Reactions   Darvon [propoxyphene] Other (See Comments)   Headache and hullicination      Medication List    TAKE these medications   acetaminophen 500 MG tablet Commonly known as: TYLENOL Take 2 tablets (1,000 mg total) by mouth every 8 (eight) hours as needed for mild pain or fever.   amoxicillin-clavulanate 875-125 MG tablet Commonly known as: Augmentin Take 1 tablet by mouth every 12 (twelve) hours for 5 days.   hydroxychloroquine 200 MG tablet Commonly known as: PLAQUENIL Take by mouth.   ibuprofen 200 MG tablet Commonly known as: ADVIL Take 3 tablets (600 mg total) by mouth every 6 (six) hours as needed (pain not controlled with tylenol).   levothyroxine 112 MCG tablet Commonly known as: SYNTHROID Take 112 mcg by mouth daily before breakfast.   lisinopril-hydrochlorothiazide 10-12.5 MG tablet Commonly known as: ZESTORETIC Take 1 tablet by mouth daily.   pramipexole 1 MG tablet Commonly known as: MIRAPEX Take 1 mg by mouth at bedtime.   pravastatin 20 MG tablet Commonly known as: PRAVACHOL Take 20 mg by mouth daily.   traMADol  50 MG tablet Commonly known as: ULTRAM Take 1 tablet (50 mg total) by  mouth every 6 (six) hours as needed (pain not controlled with tylenol and ibuprofen first).         Follow-up Information    [facility]. Go on 02/01/2020.   Specialty: General Surgery Why: 8:30 AM. Please arrive 30 min prior to appointment time. Bring photo ID and insurance information.  Contact information: [address] [place] [address] [phone]               Signed: Juliet Rude , [facility] 01/10/2020, 9:23 AM Please see Amion for pager number during day hours 7:00am-4:30pm

## 2020-01-11 LAB — SURGICAL PATHOLOGY

## 2020-01-12 NOTE — Anesthesia Postprocedure Evaluation (Signed)
Anesthesia Post Note  Patient: Jill Mcintosh  Procedure(s) Performed: APPENDECTOMY LAPAROSCOPIC (N/A )     Patient location during evaluation: PACU Anesthesia Type: General Level of consciousness: awake and alert Pain management: pain level controlled Vital Signs Assessment: post-procedure vital signs reviewed and stable Respiratory status: spontaneous breathing, nonlabored ventilation, respiratory function stable and patient connected to nasal cannula oxygen Cardiovascular status: blood pressure returned to baseline and stable Postop Assessment: no apparent nausea or vomiting Anesthetic complications: no   No complications documented.  Last Vitals:  Vitals:   01/10/20 0149 01/10/20 0553  BP: 102/61 106/60  Pulse: 69 (!) 57  Resp: 18 18  Temp: 36.7 C 36.7 C  SpO2: 93% 97%    Last Pain:  Vitals:   01/10/20 0800  TempSrc:   PainSc: 0-No pain                 Olajuwon Fosdick

## 2020-09-05 ENCOUNTER — Other Ambulatory Visit: Payer: Self-pay | Admitting: Family Medicine

## 2020-09-05 DIAGNOSIS — Z1231 Encounter for screening mammogram for malignant neoplasm of breast: Secondary | ICD-10-CM

## 2020-10-09 ENCOUNTER — Ambulatory Visit
Admission: RE | Admit: 2020-10-09 | Discharge: 2020-10-09 | Disposition: A | Discharge status: Home / Self Care | Payer: BC Managed Care – PPO | Source: Ambulatory Visit | Attending: Family Medicine | Admitting: Family Medicine

## 2020-10-09 ENCOUNTER — Other Ambulatory Visit: Payer: Self-pay

## 2020-10-09 DIAGNOSIS — Z1231 Encounter for screening mammogram for malignant neoplasm of breast: Secondary | ICD-10-CM

## 2021-01-10 LAB — LIPID PANEL
Cholesterol: 187 (ref 0–200)
LDL Cholesterol: 117
Triglycerides: 178 — AB (ref 40–160)

## 2021-01-10 LAB — HEMOGLOBIN A1C: Hemoglobin A1C: 6.1

## 2021-01-24 ENCOUNTER — Other Ambulatory Visit: Payer: Self-pay | Admitting: Family Medicine

## 2021-01-24 DIAGNOSIS — E2839 Other primary ovarian failure: Secondary | ICD-10-CM

## 2021-02-13 ENCOUNTER — Other Ambulatory Visit: Payer: Self-pay

## 2021-02-13 ENCOUNTER — Ambulatory Visit
Admission: RE | Admit: 2021-02-13 | Discharge: 2021-02-13 | Disposition: A | Discharge status: Home / Self Care | Payer: BC Managed Care – PPO | Source: Ambulatory Visit | Attending: Family Medicine | Admitting: Family Medicine

## 2021-02-13 DIAGNOSIS — E2839 Other primary ovarian failure: Secondary | ICD-10-CM

## 2021-02-28 DIAGNOSIS — Z0289 Encounter for other administrative examinations: Secondary | ICD-10-CM

## 2021-03-07 ENCOUNTER — Ambulatory Visit (INDEPENDENT_AMBULATORY_CARE_PROVIDER_SITE_OTHER): Payer: BC Managed Care – PPO | Admitting: Family Medicine

## 2021-03-07 ENCOUNTER — Other Ambulatory Visit: Payer: Self-pay

## 2021-03-07 ENCOUNTER — Encounter (INDEPENDENT_AMBULATORY_CARE_PROVIDER_SITE_OTHER): Payer: Self-pay | Admitting: Family Medicine

## 2021-03-07 VITALS — BP 137/80 | HR 74 | Temp 98.4°F | Ht 61.0 in | Wt 184.0 lb

## 2021-03-07 DIAGNOSIS — I10 Essential (primary) hypertension: Secondary | ICD-10-CM | POA: Diagnosis not present

## 2021-03-07 DIAGNOSIS — Z1331 Encounter for screening for depression: Secondary | ICD-10-CM

## 2021-03-07 DIAGNOSIS — E669 Obesity, unspecified: Secondary | ICD-10-CM

## 2021-03-07 DIAGNOSIS — E611 Iron deficiency: Secondary | ICD-10-CM

## 2021-03-07 DIAGNOSIS — R5383 Other fatigue: Secondary | ICD-10-CM

## 2021-03-07 DIAGNOSIS — E038 Other specified hypothyroidism: Secondary | ICD-10-CM

## 2021-03-07 DIAGNOSIS — E7849 Other hyperlipidemia: Secondary | ICD-10-CM

## 2021-03-07 DIAGNOSIS — Z9189 Other specified personal risk factors, not elsewhere classified: Secondary | ICD-10-CM

## 2021-03-07 DIAGNOSIS — R0602 Shortness of breath: Secondary | ICD-10-CM

## 2021-03-07 DIAGNOSIS — E039 Hypothyroidism, unspecified: Secondary | ICD-10-CM | POA: Insufficient documentation

## 2021-03-07 DIAGNOSIS — R7303 Prediabetes: Secondary | ICD-10-CM

## 2021-03-07 DIAGNOSIS — Z6834 Body mass index (BMI) 34.0-34.9, adult: Secondary | ICD-10-CM

## 2021-03-07 NOTE — Progress Notes (Signed)
Office: [phone]  /  Fax: [phone]    Date: March 19, 2021   Appointment Start Time: 3:00pm Duration: 20 minutes Provider: Lawerance Cruel, Psy.D. Type of Session: Intake for Individual Therapy  Location of Patient: Home (private location) Location of Provider: Provider's home (private office) Type of Contact: Telepsychological Visit via MyChart Video Visit  Informed Consent: Prior to proceeding with today's appointment, two pieces of identifying information were obtained. In addition, Jill Mcintosh's physical location at the time of this appointment was obtained as well a phone number she could be reached at in the event of technical difficulties. Jill Mcintosh and this provider participated in today's telepsychological service.   The provider's role was explained to [employer]. The provider reviewed and discussed issues of confidentiality, privacy, and limits therein (e.g., reporting obligations). In addition to verbal informed consent, written informed consent for psychological services was obtained prior to the initial appointment. Since the clinic is not a 24/7 crisis center, mental health emergency resources were shared and this  provider explained MyChart, e-mail, voicemail, and/or other messaging systems should be utilized only for non-emergency reasons. This provider also explained that information obtained during appointments will be placed in Jill Mcintosh's medical record and relevant information will be shared with other providers at [facility] for coordination of care. Landree agreed information may be shared with other [facility] providers as needed for coordination of care and by signing the service agreement document, she provided written consent for coordination of care. Prior to initiating telepsychological services, Jill Mcintosh completed an informed consent document, which included the development of a safety plan (i.e., an emergency contact and emergency  resources) in the event of an emergency/crisis. Jill Mcintosh verbally acknowledged understanding she is ultimately responsible for understanding her insurance benefits for telepsychological and in-person services. This provider also reviewed confidentiality, as it relates to telepsychological services, as well as the rationale for telepsychological services (i.e., to reduce exposure risk to COVID-19). Jill Mcintosh  acknowledged understanding that appointments cannot be recorded without both party consent and she is aware she is responsible for securing confidentiality on her end of the session. Jill Mcintosh verbally consented to proceed.  Chief Complaint/HPI: Jill Mcintosh was referred by Dr. Thomasene Lot due to  her depression screening . Per the note for the initial visit with Dr. Thomasene Lot on March 07, 2021, "Jill Mcintosh declines the need for medications for mood, but she agrees to see Dr. Dewaine Conger, our Bariatric Psychologist for her "eating to comfort herself" or when she is bored." The note for the initial appointment further indicated the following: "Jill Mcintosh's habits were reviewed today and are as follows: Her family eats meals together, she thinks her family will eat healthier with her, her desired weight loss is 34-44 lbs, she has been heavy most of her life, she started gaining weight during teenage years, her heaviest weight ever was 225 pounds, she has significant food cravings issues, she snacks frequently in the evenings, she skips meals frequently, she is frequently drinking liquids with calories, she frequently makes poor food choices, she has problems with excessive hunger, she frequently eats larger portions than normal, and she struggles with emotional eating." Jill Mcintosh's Food and Mood (modified PHQ-9) score on March 07, 2021 was 20.  During today's appointment, Jill Mcintosh shared she eats due to boredom and comfort. She was verbally administered a questionnaire assessing various behaviors related to emotional eating  behaviors. Jill Mcintosh endorsed the following: overeat when you are celebrating, experience food cravings on a regular basis, find food is  comforting to you, overeat frequently when you are bored or lonely, not worry about what you eat when you are in a good mood, overeat when you are alone, but eat much less when you are with other people, and eat as a reward. Jill Mcintosh believes the onset of emotional eating behaviors was likely in childhood and described the current frequency of emotional eating behaviors as "couple times a month." In addition, Jill Mcintosh denied a history of binge eating behaviors. Jill Mcintosh denied a history of restricting food intake, purging and engagement in other compensatory strategies, and has never been diagnosed with an eating disorder. She also denied a history of treatment for emotional eating. Furthermore, Jill Mcintosh denied other problems of concern.    Mental Status Examination:  Appearance: neat Behavior: appropriate to circumstances Mood: neutral Affect: mood congruent Speech: WNL Eye Contact: appropriate Psychomotor Activity: WNL Gait: unable to assess  Thought Process: linear, logical, and goal directed and denies suicidal, homicidal, and self-harm ideation, plan and intent  Thought Content/Perception: no hallucinations, delusions, bizarre thinking or behavior endorsed or observed Orientation: AAOx4 Memory/Concentration: memory, attention, language, and fund of knowledge intact  Insight/Judgment: fair  Family & Psychosocial History: Jill Mcintosh reported she is married and she has one adult child (age 63). She indicated she is currently retired, noting she will occassionally go back to work with [employer]. Additionally, Jill Mcintosh shared her highest level of education obtained is "couple years of college." Currently, Jill Mcintosh's social support system consists of her husband. Moreover, Jill Mcintosh stated she resides with her husband and daughter.   Medical History:  Past  Medical History:  Diagnosis Date   Allergic rhinitis    Anxiety    B12 deficiency    Bradycardia    Dry eyes and mouth    Elevated cholesterol    Fatty liver    Gallbladder problem    H/O bilateral breast reduction surgery    Heartburn    Hypertension    Insomnia    Joint pain    Lupus (HCC)    Mild tricuspid regurgitation    Raynaud's syndrome    Restless leg syndrome    Seasonal allergies    Sicca syndrome (HCC)    Sjogren's disease (HCC)    SOB (shortness of breath)    Thyroid disease    Vitamin D deficiency    Past Surgical History:  Procedure Laterality Date   ANUS SURGERY  08/2018   Anal Gland   BREAST REDUCTION SURGERY     CHOLECYSTECTOMY     LAPAROSCOPIC APPENDECTOMY N/A 01/09/2020   Procedure: APPENDECTOMY LAPAROSCOPIC;  Surgeon: Andria Meuse, MD;  Location: [facility];  Service: General;  Laterality: N/A;   NASAL SINUS SURGERY     REDUCTION MAMMAPLASTY Bilateral    TONSILLECTOMY     Current Outpatient Medications on File Prior to Visit  Medication Sig Dispense Refill   acetaminophen (TYLENOL) 500 MG tablet Take 2 tablets (1,000 mg total) by mouth every 8 (eight) hours as needed for mild pain or fever.     B Complex Vitamins (B COMPLEX-B12 PO) Take by mouth.     Bioflavonoid Products (SUPER-C 1000 PO) Take by mouth.     cetirizine (ZYRTEC) 10 MG tablet Take 10 mg by mouth daily.     ferrous sulfate 324 MG TBEC Take 324 mg by mouth.     folic acid (FOLVITE) 1 MG tablet Take 1 mg by mouth daily.     hydroxychloroquine (PLAQUENIL) 200 MG tablet Take by mouth.  ibuprofen (ADVIL) 200 MG tablet Take 3 tablets (600 mg total) by mouth every 6 (six) hours as needed (pain not controlled with tylenol).     levothyroxine (SYNTHROID) 125 MCG tablet Take 112 mcg by mouth daily before breakfast.     lisinopril-hydrochlorothiazide (ZESTORETIC) 20-12.5 MG tablet Take 1 tablet by mouth daily.     meloxicam (MOBIC) 15 MG tablet Take 15 mg by mouth daily.      Methotrexate, PF, 25 MG/0.5ML SOAJ Inject into the skin.     pramipexole (MIRAPEX) 1 MG tablet Take 1 mg by mouth at bedtime.     pravastatin (PRAVACHOL) 20 MG tablet Take 20 mg by mouth daily.     zolpidem (AMBIEN) 10 MG tablet Take 10 mg by mouth at bedtime as needed for sleep. 1/2 tablet at bedtime     No current facility-administered medications on file prior to visit.  Medication compliant.   Mental Health History: Jill Mcintosh reported she has never attended therapeutic services. She is currently prescribed Ambien by her PCP. Jill Mcintosh reported there is no history of hospitalizations for psychiatric concerns. Jill Mcintosh denied a family history of mental health/substance abuse related concerns. Jill Mcintosh reported there is no history of trauma including psychological, physical , and sexual abuse, as well as neglect.   Jill Mcintosh described her typical mood lately as "pretty good." Aside from concerns noted above and endorsed on the PHQ-9 and GAD-7, Jill Mcintosh reported frequently thinking about what she needs to do or did when trying to sleep. Jill Mcintosh endorsed social alcohol use (approximately 1-2 standard alcoholic beverages once a month). She denied tobacco use. She denied illicit/recreational substance use. Furthermore, Jill Mcintosh indicated she is not experiencing the following: hallucinations and delusions, paranoia, symptoms of mania , social withdrawal, crying spells, panic attacks, memory concerns, attention and concentration issues, and obsessions and compulsions. She also denied history of and current suicidal ideation, plan, and intent; history of and current homicidal ideation, plan, and intent; and history of and current engagement in self-harm. Notably, Jill Mcintosh endorsed item 9 (i.e., "Do you feel that your weight problem is so hopeless that sometimes life doesn't seem worth living?") on the modified PHQ-9 during her initial appointment with Dr. Thomasene Loteborah Opalski on March 07, 2021. She clarified she endorsed the  item due to feeling stuck about her weight and not due to suicidal ideation.   The following strengths were reported by Jill Mcintosh: strong person; independent; and self-reliant. The following strengths were observed by this provider: ability to express thoughts and feelings during the therapeutic session, ability to establish and benefit from a therapeutic relationship, willingness to work toward established goal(s) with the clinic and ability to engage in reciprocal conversation.   Legal History: Jill Mcintosh reported there is no history of legal involvement.   Structured Assessments Results: The Patient Health Questionnaire-9 (PHQ-9) is a self-report measure that assesses symptoms and severity of depression over the course of the last two weeks. Jill Mcintosh obtained a score of 1 suggesting minimal depression. Jill Mcintosh finds the endorsed symptoms to be not difficult at all. [0= Not at all; 1= Several days; 2= More than half the days; 3= Nearly every day] Little interest or pleasure in doing things 0  Feeling down, depressed, or hopeless 0  Trouble falling or staying asleep, or sleeping too much 1  Feeling tired or having little energy 0  Poor appetite or overeating 0  Feeling bad about yourself --- or that you are a failure or have let yourself or your family down 0  Trouble concentrating  on things, such as reading the newspaper or watching television 0  Moving or speaking so slowly that other people could have noticed? Or the opposite --- being so fidgety or restless that you have been moving around a lot more than usual 0  Thoughts that you would be better off dead or hurting yourself in some way 0  PHQ-9 Score 1    The Generalized Anxiety Disorder-7 (GAD-7) is a brief self-report measure that assesses symptoms of anxiety over the course of the last two weeks. Mablean obtained a score of 2 suggesting minimal anxiety. Inta finds the endorsed symptoms to be not difficult at all. [0= Not at all; 1= Several  days; 2= Over half the days; 3= Nearly every day] Feeling nervous, anxious, on edge 0  Not being able to stop or control worrying 0  Worrying too much about different things 0  Trouble relaxing 2  Being so restless that it's hard to sit still 0  Becoming easily annoyed or irritable 0  Feeling afraid as if something awful might happen 0  GAD-7 Score 2   Interventions:  Conducted a chart review Focused on rapport building Verbally administered PHQ-9 and GAD-7 for symptom monitoring Verbally administered Food & Mood questionnaire to assess various behaviors related to emotional eating Provided emphatic reflections and validation Psychoeducation provided regarding physical versus emotional hunger  Provisional DSM-5 Diagnosis(es): F50.89 Other Specified Feeding or Eating Disorder, Emotional Eating Behaviors  Plan: Kishawna declined future appointments with this provider at this time, noting, "I think I'm pretty level right now." She will be sent a handout via e-mail to increase awareness of hunger patterns and subsequent eating. Dany provided verbal consent during today's appointment for this provider to send the handout via e-mail. She acknowledged understanding that she may request a follow-up appointment with this provider in the future as long as she is still established with the clinic. No further follow-up planned by this provider.

## 2021-03-08 LAB — HEMOGLOBIN A1C
Est. average glucose Bld gHb Est-mCnc: 117 mg/dL
Hgb A1c MFr Bld: 5.7 % — ABNORMAL HIGH (ref 4.8–5.6)

## 2021-03-08 LAB — T4, FREE: Free T4: 1.26 ng/dL (ref 0.82–1.77)

## 2021-03-08 LAB — FOLATE: Folate: 14.7 ng/mL (ref >3.0)

## 2021-03-08 LAB — LIPID PANEL WITH LDL/HDL RATIO
Cholesterol, Total: 183 mg/dL (ref 100–199)
HDL: 38 mg/dL — ABNORMAL LOW (ref >39)
LDL Chol Calc (NIH): 125 mg/dL — ABNORMAL HIGH (ref 0–99)
LDL/HDL Ratio: 3.3 ratio — ABNORMAL HIGH (ref 0.0–3.2)
Triglycerides: 110 mg/dL (ref 0–149)
VLDL Cholesterol Cal: 20 mg/dL (ref 5–40)

## 2021-03-08 LAB — VITAMIN D 25 HYDROXY (VIT D DEFICIENCY, FRACTURES): Vit D, 25-Hydroxy: 31.2 ng/mL (ref 30.0–100.0)

## 2021-03-08 LAB — VITAMIN B12: Vitamin B-12: 488 pg/mL (ref 232–1245)

## 2021-03-08 LAB — TSH: TSH: 3.14 u[IU]/mL (ref 0.450–4.500)

## 2021-03-08 LAB — INSULIN, RANDOM: INSULIN: 44.8 u[IU]/mL — ABNORMAL HIGH (ref 2.6–24.9)

## 2021-03-08 NOTE — Progress Notes (Signed)
Chief Complaint:   OBESITY Jill MassonBarbara H Mcintosh (MR# [id number]) is a 61 y.o. female who presents for evaluation and treatment of obesity and related comorbidities. Current BMI is Body mass index is 34.77 kg/m. Jill Mcintosh has been struggling with her weight for many years and has been unsuccessful in either losing weight, maintaining weight loss, or reaching her healthy weight goal.  Jill Mcintosh works part time 20-24 hours per week. She lives with her husband. In the past she has lost 100 lbs by eating healthy and exercise (low carb, no calorie beverages, and more veggies). [facility] is [facility], and I cannot review her PCP's previous office notes or labs.  Jill Mcintosh is currently in the action stage of change and ready to dedicate time achieving and maintaining a healthier weight. Jill Mcintosh is interested in becoming our patient and working on intensive lifestyle modifications including (but not limited to) diet and exercise for weight loss.  Jill Mcintosh's habits were reviewed today and are as follows: Her family eats meals together, she thinks her family will eat healthier with her, her desired weight loss is 34-44 lbs, she has been heavy most of her life, she started gaining weight during teenage years, her heaviest weight ever was 225 pounds, she has significant food cravings issues, she snacks frequently in the evenings, she skips meals frequently, she is frequently drinking liquids with calories, she frequently makes poor food choices, she has problems with excessive hunger, she frequently eats larger portions than normal, and she struggles with emotional eating.  Depression Screen Jill Mcintosh's Food and Mood (modified PHQ-9) score was 20.  Depression screen PHQ 2/9 03/07/2021  Decreased Interest 2  Down, Depressed, Hopeless 2  PHQ - 2 Score 4  Altered sleeping 3  Tired, decreased energy 3  Change in appetite 3  Feeling bad or failure about yourself  3  Trouble concentrating 2  Moving slowly or  fidgety/restless 1  Suicidal thoughts 1  PHQ-9 Score 20  Difficult doing work/chores Somewhat difficult   Subjective:   1. Other fatigue Jill Mcintosh admits to daytime somnolence and admits to waking up still tired. Patient has a history of symptoms of daytime fatigue, morning fatigue, and morning headache. Jill Mcintosh generally gets 4 or 5 hours of sleep per night, and states that she has nightime awakenings. Snoring is present. Apneic episodes are not present. Epworth Sleepiness Score is 10. Per patient, she has a sleep study done a couple of years ago and it was negative for obstructive sleep apnea diagnosis or treatment.  2. SOBOE (shortness of breath on exertion) Jill Mcintosh notes increasing shortness of breath with exercising and seems to be worsening over time with weight gain. She notes getting out of breath sooner with activity than she used to. This has not gotten worse recently. Jill Mcintosh denies shortness of breath at rest or orthopnea.  3. Benign essential HTN Jill Mcintosh's blood pressure is at goal. She is taking lisinopril.  BP Readings from Last 3 Encounters:  03/07/21 137/80  01/10/20 106/60  12/29/18 (!) 145/87   4. Pre-diabetes Jill Mcintosh has a diagnosis of pre-diabetes based on her elevated HgA1c, and she is not on medications. She was informed this puts her at greater risk of developing diabetes. She continues to work on diet and exercise to decrease her risk of diabetes. She denies nausea or hypoglycemia.  5. Iron deficiency Jill Mcintosh notes restless leg syndrome and she takes Fe 65 mg once daily.  6. Other specified hypothyroidism Jill Mcintosh notes fatigue. She is taking Synthroid and  has taken the same dose for many months. No recent changes.  7. Other hyperlipidemia Jill Mcintosh has hyperlipidemia and she is taking Pravachol. She has been trying to improve her cholesterol levels with intensive lifestyle modifications including a low saturated fat diet, exercise, and weight loss. She denies any  chest pain, claudication, or myalgias.  8. Depression screening Jill Mcintosh's PHQ-9 score is 20, but she denies symptoms of depression to the point where she needs medications.   9. At risk of diabetes mellitus Jill Mcintosh is at higher than average risk for developing diabetes due to her pre-diabetes.   Assessment/Plan:   Orders Placed This Encounter  Procedures   Vitamin B12   Folate   Hemoglobin A1c   Insulin, random   Lipid Panel With LDL/HDL Ratio   T4, free   TSH   VITAMIN D 25 Hydroxy (Vit-D Deficiency, Fractures)   EKG 12-Lead    1. Other fatigue Jill Mcintosh does feel that her weight is causing her energy to be lower than it should be. Fatigue may be related to obesity, depression or many other causes. Labs will be ordered, and in the meanwhile, Jill Mcintosh will focus on self care including making healthy food choices, increasing physical activity and focusing on stress reduction.  - EKG 12-Lead - Vitamin B12 - Folate - VITAMIN D 25 Hydroxy (Vit-D Deficiency, Fractures)  2. SOBOE (shortness of breath on exertion) Jill Mcintosh does feel that she gets out of breath more easily that she used to when she exercises. Jill Mcintosh's shortness of breath appears to be obesity related and exercise induced. She has agreed to work on weight loss and gradually increase exercise to treat her exercise induced shortness of breath. Will continue to monitor closely.  3. Benign essential HTN Jill Mcintosh will continue working on healthy weight loss and exercise to improve blood pressure control. We will watch for signs of hypotension as she continues her lifestyle modifications.  4. Pre-diabetes We will check labs today. Jill Mcintosh will continue to work on weight loss, exercise, and decreasing simple carbohydrates to help decrease the risk of diabetes.   - Hemoglobin A1c - Insulin, random  5. Iron deficiency Jill Mcintosh will continue her Fe supplement. Orders and follow up as documented in patient  record.  Counseling Iron is essential for our bodies to make red blood cells.  Reasons that someone may be deficient include: an iron-deficient diet (more likely in those following vegan or vegetarian diets), women with heavy menses, patients with GI disorders or poor absorption, patients that have had bariatric surgery, frequent blood donors, patients with cancer, and patients with heart disease.   Iron-rich foods include dark leafy greens, red and white meats, eggs, seafood, and beans.   Certain foods and drinks prevent your body from absorbing iron properly. Avoid eating these foods in the same meal as iron-rich foods or with iron supplements. These foods include: coffee, black tea, and red wine; milk, dairy products, and foods that are high in calcium; beans and soybeans; whole grains.  Constipation can be a side effect of iron supplementation. Increased water and fiber intake are helpful. Water goal: > 2 liters/day. Fiber goal: > 25 grams/day.  6. Other specified hypothyroidism We will check labs today. Orders and follow up as documented in patient record.  Counseling Good thyroid control is important for overall health. Jill Mcintosh thyroid levels are dangerous and will not improve weight loss results. Counseling: The correct way to take levothyroxine is fasting, with water, separated by at least 30 minutes from breakfast, and separated  by more than 4 hours from calcium, iron, multivitamins, acid reflux medications (PPIs).   - T4, free - TSH  7. Other hyperlipidemia Cardiovascular risk and specific lipid/LDL goals reviewed.  We discussed several lifestyle modifications today. We will check labs today. Jill Mcintosh will continue to work on diet, exercise and weight loss efforts. Orders and follow up as documented in patient record.   Counseling Intensive lifestyle modifications are the first line treatment for this issue. Dietary changes: Increase soluble fiber. Decrease simple  carbohydrates. Exercise changes: Moderate to vigorous-intensity aerobic activity 150 minutes per week if tolerated. Lipid-lowering medications: see documented in medical record.  - Lipid Panel With LDL/HDL Ratio  8. Depression screening Jill Mcintosh had a positive depression screening. Depression is commonly associated with obesity and often results in emotional eating behaviors. We will monitor this closely and work on CBT to help improve the non-hunger eating patterns. Jill Mcintosh declines the need for medications for mood, but she agrees to see Dr. Dewaine Conger, our Bariatric Psychologist for her "eating to comfort herself" or when she is bored.   9. At risk of diabetes mellitus Jill Mcintosh was given approximately 23 minutes of diabetic education and counseling today. We discussed intensive lifestyle modifications today with an emphasis on weight loss as well as increasing exercise and decreasing simple carbohydrates in her diet. We also reviewed medication options with an emphasis on risk versus benefits of those discussed.  Repetitive spaced learning was employed today to elicit superior memory formation and behavioral change.  10. Obesity with current BMI of 34.8 Jill Mcintosh is currently in the action stage of change and her goal is to continue with weight loss efforts. I recommend Jill Mcintosh begin the structured treatment plan as follows:  She has agreed to the Category 2 Plan.  Exercise goals: As is.   Behavioral modification strategies: increasing lean protein intake, decreasing simple carbohydrates, and planning for success.  She was informed of the importance of frequent follow-up visits to maximize her success with intensive lifestyle modifications for her multiple health conditions. She was informed we would discuss her lab results at her next visit unless there is a critical issue that needs to be addressed sooner. Jill Mcintosh agreed to keep her next visit at the agreed upon time to discuss these  results.  Objective:   Blood pressure 137/80, pulse 74, temperature 98.4 F (36.9 C), height 5\' 1"  (1.549 m), weight 184 lb (83.5 kg), SpO2 98 %. Body mass index is 34.77 kg/m.  EKG: Normal sinus rhythm, rate 70 BPM.  Indirect Calorimeter completed today shows a VO2 of 267 and a REE of 1843.  Her calculated basal metabolic rate is 9021 thus her basal metabolic rate is better than expected.  General: Cooperative, alert, well developed, in no acute distress. HEENT: Conjunctivae and lids unremarkable. Cardiovascular: Regular rhythm.  Lungs: Normal work of breathing. Neurologic: No focal deficits.   Lab Results  Component Value Date   CREATININE 0.83 01/09/2020   BUN 11 01/09/2020   NA 134 (L) 01/09/2020   K 4.2 01/09/2020   CL 99 01/09/2020   CO2 24 01/09/2020   Lab Results  Component Value Date   ALT 54 (H) 01/09/2020   AST 37 01/09/2020   ALKPHOS 56 01/09/2020   BILITOT 0.8 01/09/2020   Lab Results  Component Value Date   HGBA1C 5.7 (H) 03/07/2021   Lab Results  Component Value Date   INSULIN 44.8 (H) 03/07/2021   Lab Results  Component Value Date   TSH 3.140 03/07/2021  Lab Results  Component Value Date   CHOL 183 03/07/2021   HDL 38 (L) 03/07/2021   LDLCALC 125 (H) 03/07/2021   TRIG 110 03/07/2021   Lab Results  Component Value Date   WBC 9.3 01/09/2020   HGB 13.7 01/09/2020   HCT 41.1 01/09/2020   MCV 86.7 01/09/2020   PLT 254 01/09/2020   No results found for: IRON, TIBC, FERRITIN  Attestation Statements:   Reviewed by clinician on day of visit: allergies, medications, problem list, medical history, surgical history, family history, social history, and previous encounter notes.  Trude Mcburney, am acting as transcriptionist for [employer], DO.  I have reviewed the above documentation for accuracy and completeness, however, I was not given the hand written chart to verify information. Jill Grippe, D.O.  The 21st Century Cures Act  was signed into law in 2016 which includes the topic of electronic health records.  This provides immediate access to information in MyChart.  This includes consultation notes, operative notes, office notes, lab results and pathology reports.  If you have any questions about what you read please let us know at your next visit so we can discuss your concerns and take corrective action if need be.  We are right here with you.

## 2021-03-19 ENCOUNTER — Telehealth (INDEPENDENT_AMBULATORY_CARE_PROVIDER_SITE_OTHER): Payer: BC Managed Care – PPO | Admitting: Psychology

## 2021-03-19 DIAGNOSIS — F5089 Other specified eating disorder: Secondary | ICD-10-CM | POA: Diagnosis not present

## 2021-03-21 ENCOUNTER — Ambulatory Visit (INDEPENDENT_AMBULATORY_CARE_PROVIDER_SITE_OTHER): Payer: BC Managed Care – PPO | Admitting: Family Medicine

## 2021-04-04 ENCOUNTER — Encounter (INDEPENDENT_AMBULATORY_CARE_PROVIDER_SITE_OTHER): Payer: Self-pay | Admitting: Family Medicine

## 2021-04-04 ENCOUNTER — Ambulatory Visit (INDEPENDENT_AMBULATORY_CARE_PROVIDER_SITE_OTHER): Payer: BC Managed Care – PPO | Admitting: Family Medicine

## 2021-04-04 VITALS — BP 117/71 | HR 69 | Temp 98.0°F | Ht 61.0 in | Wt 168.0 lb

## 2021-04-04 DIAGNOSIS — E559 Vitamin D deficiency, unspecified: Secondary | ICD-10-CM

## 2021-04-04 DIAGNOSIS — E7849 Other hyperlipidemia: Secondary | ICD-10-CM | POA: Diagnosis not present

## 2021-04-04 DIAGNOSIS — I10 Essential (primary) hypertension: Secondary | ICD-10-CM | POA: Diagnosis not present

## 2021-04-04 DIAGNOSIS — Z6831 Body mass index (BMI) 31.0-31.9, adult: Secondary | ICD-10-CM

## 2021-04-04 DIAGNOSIS — R7303 Prediabetes: Secondary | ICD-10-CM | POA: Diagnosis not present

## 2021-04-04 DIAGNOSIS — F5089 Other specified eating disorder: Secondary | ICD-10-CM

## 2021-04-04 DIAGNOSIS — E611 Iron deficiency: Secondary | ICD-10-CM

## 2021-04-04 DIAGNOSIS — Z9189 Other specified personal risk factors, not elsewhere classified: Secondary | ICD-10-CM | POA: Diagnosis not present

## 2021-04-04 DIAGNOSIS — E038 Other specified hypothyroidism: Secondary | ICD-10-CM

## 2021-04-04 DIAGNOSIS — E669 Obesity, unspecified: Secondary | ICD-10-CM

## 2021-04-04 MED ORDER — VITAMIN D (ERGOCALCIFEROL) 1.25 MG (50000 UNIT) PO CAPS: 50000.0000 [IU] | ORAL_CAPSULE | ORAL | 0 refills | Status: DC

## 2021-04-04 NOTE — Patient Instructions (Signed)
The 10-year ASCVD risk score (Arnett DK, et al., 2019) is: 4.4% ?  Values used to calculate the score: ?    Age: 61 years ?    Sex: Female ?    Is Non-Hispanic African American: No ?    Diabetic: No ?    Tobacco smoker: No ?    Systolic Blood Pressure: 117 mmHg ?    Is BP treated: Yes ?    HDL Cholesterol: 38 mg/dL ?    Total Cholesterol: 183 mg/dL ? ?

## 2021-04-05 ENCOUNTER — Ambulatory Visit (INDEPENDENT_AMBULATORY_CARE_PROVIDER_SITE_OTHER): Payer: BC Managed Care – PPO | Admitting: Family Medicine

## 2021-04-12 NOTE — Progress Notes (Signed)
? ? ? ?Chief Complaint:  ? ?OBESITY ?Jill Mcintosh is here to discuss her progress with her obesity treatment plan along with follow-up of her obesity related diagnoses. Jill Mcintosh is on the Category 2 Plan and states she is following her eating plan approximately 95% of the time. 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 ? ?Today's visit was #: 2 ?Starting weight: 184 lbs ?Starting date: 03/07/2021 ?Today's weight: 168 lbs ?Today's date: 04/04/2021 ?Total lbs lost to date: 16 lbs ?Total lbs lost since last in-office visit: 16 lbs ? ?Interim History: Jill Mcintosh is here today for her first follow-up office visit since starting the program with [place].  All blood work/ lab tests that were recently ordered by myself or an outside provider were reviewed with patient today per their request.   Extended time was spent counseling her on all new disease processes that were discovered or preexisting ones that are affected by BMI.  she understands that many of these abnormalities will need to monitored regularly along with the current treatment plan of prudent dietary changes, in which we are making each and every office visit, to improve these health parameters. ? ?Jill Mcintosh is still craving sweets because, "I had 2 birthdays to celebrate."  Despite following the meal plan super closely and eating everything on plan, she lost almost 10% of her weight in 4 weeks.  No issues at all with the meal plan. ? ?Subjective:  ? ?1. Essential hypertension ?Discussed labs with patient today.  ?Asymptomatic.  No concerns.  Taking Zestoretic. Review: taking medications as instructed, no medication side effects noted, no chest pain on exertion, no dyspnea on exertion, no swelling of ankles.  ? ?BP Readings from Last 3 Encounters:  ?04/04/21 117/71  ?03/07/21 137/80  ?01/10/20 106/60  ? ?2. Prediabetes ?Discussed labs with patient today.  ?A1c 5.7 most recently and patient thinks improved but we do not have other PCP results. ? ?3. Other  hyperlipidemia ?Discussed labs with patient today.  ?Taking medications several years now.  Tolerating well.  Compliance good!  Taking Pravachol. ? ?4. Vitamin D deficiency ?New.  Discussed labs with patient today.  ?She recently had a bone density test, which was normal per GYN. ? ?Lab Results  ?Component Value Date  ? VD25OH 31.2 03/07/2021  ? ?5. Other specified hypothyroidism ?Discussed labs with patient today.  ?She is taking Synthroid.  Asymptomatic.  Medication compliance good. ? ?6. Iron deficiency ?Discussed labs with patient today.  ?Has restless legs syndrome.  Symptoms controlled with 325 mg daily. ?H/H is 13.1 and 38.5.  MCV and MCH and RBC within normal limits. ? ?7. Other specified eating disorder with emotional eating ?Saw Dr. Dewaine Conger and found it "useful".  She does not feel that she has been doing much emotional eating at all lately.  She is not sure she wants a second office visit with her. ? ?8. At risk for heart disease ?[name] is at higher than average risk for cardiovascular disease due to hyperlipidemia, prediabetes, hypertension.    ? ?Assessment/Plan:  ?No orders of the defined types were placed in this encounter. ? ? ?There are no discontinued medications.  ? ?Meds ordered this encounter  ?Medications  ? Vitamin D, Ergocalciferol, (DRISDOL) 1.25 MG (50000 UNIT) CAPS capsule  ?  Sig: Take 1 capsule (50,000 Units total) by mouth every 7 (seven) days.  ?  Dispense:  4 capsule  ?  Refill:  0  ?  ? ?1. Essential hypertension ?At goal and improved since  she has been on meal plan.  Continue prudent nutritional plan and weight loss.  Decrease salt and prepared foods. ? ?2. Prediabetes ?Discussed with patient A1c and FI.  Gave handouts on condition and metformin.  Will consider starting in the future if "sweets cravings" do not improve. ? ?3. Other hyperlipidemia ?LDL elevated and HDL too low.  Advised to decreased saturated and trans fats and discussed examples, continue meds per PCP, eventually  exercise. ? ?4. Vitamin D deficiency ?Low Vitamin D level contributes to fatigue and are associated with obesity, breast, and colon cancer. She agrees to start to take prescription Vitamin D @50 ,000 IU every week and will follow-up for routine testing of Vitamin D, at least 2-3 times per year to avoid over-replacement.  ? ?- Plan Vitamin D, Ergocalciferol, (DRISDOL) 1.25 MG (50000 UNIT) CAPS capsule; Take 1 capsule (50,000 Units total) by mouth every 7 (seven) days.  Dispense: 4 capsule; Refill: 0 ? ?5. Other specified hypothyroidism ?At goal.  Continue medication at current dose.  Will monitor alongside PCP. ? ?6. Iron deficiency ?CBC within normal limits on 01/10/2021.  No evidence of iron deficiency.  Continue current dose of iron.  Focus on iron-rich foods. ? ?7. Other specified eating disorder with emotional eating ?She will follow-up with Dr. 03/10/2021 as needed. ? ?8. At risk for heart disease ?Due to Dewaine Conger current state of health and medical condition(s), she is at a higher risk for heart disease.  This puts the patient at much greater risk to subsequently develop cardiopulmonary conditions that can significantly affect patient's quality of life in a negative manner as well.   ? ?At least 24 minutes were spent on counseling Jill Mcintosh about these concerns today, and we discussed the importance of reversing risks factors of obesity, especially truncal and visceral fat, hypertension, hyperlipidemia, and pre-diabetes.  The initial goal is to lose at least 5-10% of starting weight to help reduce these risk factors.  Counseling: Intensive lifestyle modifications were discussed with [name] as the most appropriate first line of treatment.  she will continue to work on diet, exercise, and weight loss efforts.  We will continue to reassess these conditions on a fairly regular basis in an attempt to decrease the patient's overall morbidity and mortality. ? ?Evidence-based interventions  for health behavior change were utilized today including the discussion of self monitoring techniques, problem-solving barriers, and SMART goal setting techniques.  Specifically, regarding patient's less desirable eating habits and patterns, we employed the technique of small changes when [name] has not been able to fully commit to her prudent nutritional plan. ?  ?9. Obesity with current BMI of 31.8 ? ?Jill Mcintosh is currently in the action stage of change. As such, her goal is to continue with weight loss efforts. She has agreed to the Category 3 Plan.  ? ?Exercise goals:  As is. ? ?Behavioral modification strategies: increasing lean protein intake, decreasing simple carbohydrates, better snacking choices, and planning for success. ? ?Jayden has agreed to follow-up with our clinic in 2 weeks. She was informed of the importance of frequent follow-up visits to maximize her success with intensive lifestyle modifications for her multiple health conditions.  ? ?Objective:  ? ?Blood pressure 117/71, pulse 69, temperature 98 ?F (36.7 ?C), height 5\' 1"  (1.549 m), weight 168 lb (76.2 kg), SpO2 97 %. ?Body mass index is 31.74 kg/m?. ? ?General: Cooperative, alert, well developed, in no acute distress. ?HEENT: Conjunctivae and lids unremarkable. ?Cardiovascular: Regular rhythm.  ?  Lungs: Normal work of breathing. ?Neurologic: No focal deficits.  ? ?Lab Results  ?Component Value Date  ? CREATININE 0.83 01/09/2020  ? BUN 11 01/09/2020  ? NA 134 (L) 01/09/2020  ? K 4.2 01/09/2020  ? CL 99 01/09/2020  ? CO2 24 01/09/2020  ? ?Lab Results  ?Component Value Date  ? ALT 54 (H) 01/09/2020  ? AST 37 01/09/2020  ? ALKPHOS 56 01/09/2020  ? BILITOT 0.8 01/09/2020  ? ?Lab Results  ?Component Value Date  ? HGBA1C 5.7 (H) 03/07/2021  ? ?Lab Results  ?Component Value Date  ? INSULIN 44.8 (H) 03/07/2021  ? ?Lab Results  ?Component Value Date  ? TSH 3.140 03/07/2021  ? ?Lab Results  ?Component Value Date  ? CHOL 183 03/07/2021  ? HDL  38 (L) 03/07/2021  ? LDLCALC 125 (H) 03/07/2021  ? TRIG 110 03/07/2021  ? ?Lab Results  ?Component Value Date  ? VD25OH 31.2 03/07/2021  ? ?Lab Results  ?Component Value Date  ? WBC 9.3 01/09/2020  ? HGB 13.7 01/09/2020

## 2021-04-18 ENCOUNTER — Ambulatory Visit (INDEPENDENT_AMBULATORY_CARE_PROVIDER_SITE_OTHER): Payer: BC Managed Care – PPO | Admitting: Family Medicine

## 2021-04-18 ENCOUNTER — Encounter (INDEPENDENT_AMBULATORY_CARE_PROVIDER_SITE_OTHER): Payer: Self-pay | Admitting: Family Medicine

## 2021-04-18 VITALS — BP 109/65 | HR 64 | Temp 97.7°F | Ht 61.0 in | Wt 167.0 lb

## 2021-04-18 DIAGNOSIS — Z9189 Other specified personal risk factors, not elsewhere classified: Secondary | ICD-10-CM

## 2021-04-18 DIAGNOSIS — R7303 Prediabetes: Secondary | ICD-10-CM

## 2021-04-18 DIAGNOSIS — Z6831 Body mass index (BMI) 31.0-31.9, adult: Secondary | ICD-10-CM | POA: Diagnosis not present

## 2021-04-18 DIAGNOSIS — E559 Vitamin D deficiency, unspecified: Secondary | ICD-10-CM | POA: Diagnosis not present

## 2021-04-18 DIAGNOSIS — E669 Obesity, unspecified: Secondary | ICD-10-CM | POA: Diagnosis not present

## 2021-04-18 MED ORDER — METFORMIN HCL 500 MG PO TABS: 500.0000 mg | ORAL_TABLET | Freq: Two times a day (BID) | ORAL | 0 refills | Status: DC

## 2021-04-24 ENCOUNTER — Other Ambulatory Visit (INDEPENDENT_AMBULATORY_CARE_PROVIDER_SITE_OTHER): Payer: Self-pay | Admitting: Family Medicine

## 2021-05-01 NOTE — Progress Notes (Signed)
? ? ? ?Chief Complaint:  ? ?OBESITY ?Verner is here to discuss her progress with her obesity treatment plan along with follow-up of her obesity related diagnoses. Jaelle is on the Category 3 Plan and states she is following her eating plan approximately 95% of the time. 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  ? ?Today's visit was #: 3 ?Starting weight: 184 lbs ?Starting date: 03/07/2021 ?Today's weight: 167 lbs ?Today's date: 04/18/2021 ?Total lbs lost to date: 17 ?Total lbs lost since last in-office visit: 1 ? ?Interim History: Latysha notes sweet cravings in the afternoons and evenings. She states she is following the meal plan 95%. She lost muscle mass, and gained fat mass and gained water weight. ? ?Subjective:  ? ?1. Prediabetes- w sweets cravings ?Kalese notes sweet cravings in the afternoons and evenings. She never been on any medications.  ? ?2. Vitamin D deficiency ?Angella started [place] at her last office visit. She is tolerating it well with no side effects. Dexa was within normal limits on 02/13/2021. She brought in her results today.  ? ?3. At risk for adverse drug reaction ?Harolyn is at risk for drug side effects due to starting a new medication (metformin). ? ?Assessment/Plan:  ?No orders of the defined types were placed in this encounter. ? ? ?There are no discontinued medications.  ? ?Meds ordered this encounter  ?Medications  ? DISCONTD: metFORMIN (GLUCOPHAGE) 500 MG tablet  ?  Sig: Take 1 tablet (500 mg total) by mouth 2 (two) times daily with a meal. Lunch and dinner  ?  Dispense:  60 tablet  ?  Refill:  0  ?  ? ?1. Prediabetes- w sweets cravings ?Breyah agreed to start metformin, and with told to start with 1/2 tablet at lunch and 1/2 tablet at dinner after couple of days, and if she tolerates it well she is to increase to 1 tablet PO BID.  ? ?- metFORMIN (GLUCOPHAGE) 500 MG tablet; Take 1 tablet (500 mg total) by mouth 2 (two) times daily with a meal. Lunch and dinner  Dispense:  60 tablet; Refill: 0 ? ?2. Vitamin D deficiency ?Jeffrey will continue Ergocal and continue to increase weight bearing exercise, and continue adequate calcium by following the meal plan.  ? ?3. At risk for adverse drug reaction ?Candid was given approximately 15 minutes of drug side effect counseling today. We discussed side effect possibility and risk versus benefits. Jazzy agreed to the medication and will contact this office if these side effects are intolerable. ? ?Repetitive spaced learning was employed today to elicit superior memory formation and behavioral change. ? ?4. Obesity with current BMI of 31.6 ?Avleen is currently in the action stage of change. As such, her goal is to continue with weight loss efforts. She has agreed to the Category 3 Plan.  ? ?Exercise goals: As is.  ? ?Behavioral modification strategies: meal planning and cooking strategies, avoiding temptations, and planning for success. ? ?Cyniah has agreed to follow-up with our clinic in 2 weeks. She was informed of the importance of frequent follow-up visits to maximize her success with intensive lifestyle modifications for her multiple health conditions.  ? ?Objective:  ? ?Blood pressure 109/65, pulse 64, temperature 97.7 ?F (36.5 ?C), height 5\' 1"  (1.549 m), weight 167 lb (75.8 kg), SpO2 98 %. ?Body mass index is 31.55 kg/m?. ? ?General: Cooperative, alert, well developed, in no acute distress. ?HEENT: Conjunctivae and lids unremarkable. ?Cardiovascular: Regular rhythm.  ?Lungs: Normal work of breathing. ?Neurologic:  No focal deficits.  ? ?Lab Results  ?Component Value Date  ? CREATININE 0.83 01/09/2020  ? BUN 11 01/09/2020  ? NA 134 (L) 01/09/2020  ? K 4.2 01/09/2020  ? CL 99 01/09/2020  ? CO2 24 01/09/2020  ? ?Lab Results  ?Component Value Date  ? ALT 54 (H) 01/09/2020  ? AST 37 01/09/2020  ? ALKPHOS 56 01/09/2020  ? BILITOT 0.8 01/09/2020  ? ?Lab Results  ?Component Value Date  ? HGBA1C 5.7 (H) 03/07/2021  ? HGBA1C 6.1 01/10/2021   ? ?Lab Results  ?Component Value Date  ? INSULIN 44.8 (H) 03/07/2021  ? ?Lab Results  ?Component Value Date  ? TSH 3.140 03/07/2021  ? ?Lab Results  ?Component Value Date  ? CHOL 183 03/07/2021  ? HDL 38 (L) 03/07/2021  ? LDLCALC 125 (H) 03/07/2021  ? TRIG 110 03/07/2021  ? ?Lab Results  ?Component Value Date  ? VD25OH 31.2 03/07/2021  ? ?Lab Results  ?Component Value Date  ? WBC 9.3 01/09/2020  ? HGB 13.7 01/09/2020  ? HCT 41.1 01/09/2020  ? MCV 86.7 01/09/2020  ? PLT 254 01/09/2020  ? ?No results found for: IRON, TIBC, FERRITIN ? ?Attestation Statements:  ? ?Reviewed by clinician on day of visit: allergies, medications, problem list, medical history, surgical history, family history, social history, and previous encounter notes. ? ? ?I, Burt Knack, am acting as transcriptionist for [employer], DO. ? ?I have reviewed the above documentation for accuracy and completeness, and I agree with the above. Carlye Grippe, D.O. ? ?The 21st Century Cures Act was signed into law in 2016 which includes the topic of electronic health records.  This provides immediate access to information in MyChart.  This includes consultation notes, operative notes, office notes, lab results and pathology reports.  If you have any questions about what you read please let us know at your next visit so we can discuss your concerns and take corrective action if need be.  We are right here with you. ? ? ?

## 2021-05-02 ENCOUNTER — Encounter (INDEPENDENT_AMBULATORY_CARE_PROVIDER_SITE_OTHER): Payer: Self-pay | Admitting: Family Medicine

## 2021-05-02 ENCOUNTER — Ambulatory Visit (INDEPENDENT_AMBULATORY_CARE_PROVIDER_SITE_OTHER): Payer: BC Managed Care – PPO | Admitting: Family Medicine

## 2021-05-02 VITALS — BP 115/64 | HR 53 | Temp 98.1°F | Ht 61.0 in | Wt 165.0 lb

## 2021-05-02 DIAGNOSIS — K5909 Other constipation: Secondary | ICD-10-CM | POA: Diagnosis not present

## 2021-05-02 DIAGNOSIS — E669 Obesity, unspecified: Secondary | ICD-10-CM | POA: Diagnosis not present

## 2021-05-02 DIAGNOSIS — Z9189 Other specified personal risk factors, not elsewhere classified: Secondary | ICD-10-CM

## 2021-05-02 DIAGNOSIS — R7303 Prediabetes: Secondary | ICD-10-CM | POA: Diagnosis not present

## 2021-05-02 DIAGNOSIS — E559 Vitamin D deficiency, unspecified: Secondary | ICD-10-CM

## 2021-05-02 DIAGNOSIS — Z6831 Body mass index (BMI) 31.0-31.9, adult: Secondary | ICD-10-CM

## 2021-05-02 DIAGNOSIS — Z6834 Body mass index (BMI) 34.0-34.9, adult: Secondary | ICD-10-CM

## 2021-05-02 MED ORDER — METFORMIN HCL 500 MG PO TABS: 500.0000 mg | ORAL_TABLET | Freq: Two times a day (BID) | ORAL | 0 refills | Status: DC

## 2021-05-02 MED ORDER — VITAMIN D (ERGOCALCIFEROL) 1.25 MG (50000 UNIT) PO CAPS: 50000.0000 [IU] | ORAL_CAPSULE | ORAL | 0 refills | Status: DC

## 2021-05-21 NOTE — Progress Notes (Signed)
? ? ? ?Chief Complaint:  ? ?OBESITY ?Jill Mcintosh is here to discuss her progress with her obesity treatment plan along with follow-up of her obesity related diagnoses. Jill Mcintosh is on the Category 3 Plan and states she is following her eating plan approximately 95% of the time. 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   ? ?Today's visit was #: 4 ?Starting weight: 184 lbs ?Starting date: 03/07/2021 ?Today's weight: 165 lbs ?Today's date: 05/02/2021 ?Total lbs lost to date: 81 ?Total lbs lost since last in-office visit: 2 ? ?Interim History: We increased from Category 2 to Category 3 at her last office visit because patient lost too much weight. "It is a lot of food". Not able to get it all in. Also increased food has caused a flare of her chronic constipation.  ? ?Subjective:  ? ?1. Prediabetes- w sweets cravings ?Started last office visit office. Tolerating well and helping with sweet cravings.  ? ?2. Vitamin D deficiency ?She is currently taking prescription vitamin D 50,000 IU each week. She denies nausea, vomiting or muscle weakness. ? ?3. Other constipation ?New onset with increasing protein intake was an issue in the past for the patient as well.  ? ?4. At risk for dehydration ?Jill Mcintosh is at risk for dehydration due to inadequate water intake. ? ?Assessment/Plan:  ?No orders of the defined types were placed in this encounter. ? ? ?Medications Discontinued During This Encounter  ?Medication Reason  ? Vitamin D, Ergocalciferol, (DRISDOL) 1.25 MG (50000 UNIT) CAPS capsule Reorder  ? metFORMIN (GLUCOPHAGE) 500 MG tablet Reorder  ?  ? ?Meds ordered this encounter  ?Medications  ? metFORMIN (GLUCOPHAGE) 500 MG tablet  ?  Sig: Take 1 tablet (500 mg total) by mouth 2 (two) times daily with a meal. Lunch and dinner  ?  Dispense:  60 tablet  ?  Refill:  0  ?  30 d supply;  ** OV for RF **   Do not send RF request  ? Vitamin D, Ergocalciferol, (DRISDOL) 1.25 MG (50000 UNIT) CAPS  capsule  ?  Sig: Take 1 capsule (50,000 Units total) by mouth every 7 (seven) days.  ?  Dispense:  4 capsule  ?  Refill:  0  ?  30 d supply;  ** OV for RF **   Do not send RF request  ?  ? ?1. Prediabetes- w sweets cravings ?[name] will continue to work on weight loss, exercise, and decreasing simple carbohydrates to help decrease the risk of diabetes.  ? ?- metFORMIN (GLUCOPHAGE) 500 MG tablet; Take 1 tablet (500 mg total) by mouth 2 (two) times daily with a meal. Lunch and dinner  Dispense: 60 tablet; Refill: 0 ? ?2. Vitamin D deficiency ?We will refill prescription Vitamin D for 1 month. Jill Mcintosh will follow-up for routine testing of Vitamin D, at least 2-3 times per year to avoid over-replacement. ? ?- Vitamin D, Ergocalciferol, (DRISDOL) 1.25 MG (50000 UNIT) CAPS capsule; Take 1 capsule (50,000 Units total) by mouth every 7 (seven) days.  Dispense: 4 capsule; Refill: 0 ? ?3. Other constipation ?Education provided. Increase fiber to 40+ grams daily and increase water to 80-100 oz per day. Continue walking/exercise. ? ?4. At risk for dehydration ?[name] was given approximately 10 minutes of dehydration prevention counseling today. Jill Mcintosh is at risk for dehydration due to weight loss and current medication(s). She was encouraged to hydrate and monitor fluid status to avoid dehydration as weight loss plateaus. ? ?  5. Obesity, Current BMI 31.3 ?Jill Mcintosh is currently in the action stage of change. As such, her goal is to continue with weight loss efforts. She has agreed to the Category 3 Plan.  ? ?Exercise goals: As is. ? ?Behavioral modification strategies: increasing water intake and better snacking choices. ? ?Jill Mcintosh has agreed to follow-up with our clinic in 2 to 3 weeks. She was informed of the importance of frequent follow-up visits to maximize her success with intensive lifestyle modifications for her multiple health conditions.  ? ?Objective:  ? ?Blood pressure 115/64, pulse (!) 53, temperature 98.1 ?F (36.7  ?C), height 5\' 1"  (1.549 m), weight 165 lb (74.8 kg), SpO2 97 %. ?Body mass index is 31.18 kg/m?. ? ?General: Cooperative, alert, well developed, in no acute distress. ?HEENT: Conjunctivae and lids unremarkable. ?Cardiovascular: Regular rhythm.  ?Lungs: Normal work of breathing. ?Neurologic: No focal deficits.  ? ?Lab Results  ?Component Value Date  ? CREATININE 0.83 01/09/2020  ? BUN 11 01/09/2020  ? NA 134 (L) 01/09/2020  ? K 4.2 01/09/2020  ? CL 99 01/09/2020  ? CO2 24 01/09/2020  ? ?Lab Results  ?Component Value Date  ? ALT 54 (H) 01/09/2020  ? AST 37 01/09/2020  ? ALKPHOS 56 01/09/2020  ? BILITOT 0.8 01/09/2020  ? ?Lab Results  ?Component Value Date  ? HGBA1C 5.7 (H) 03/07/2021  ? HGBA1C 6.1 01/10/2021  ? ?Lab Results  ?Component Value Date  ? INSULIN 44.8 (H) 03/07/2021  ? ?Lab Results  ?Component Value Date  ? TSH 3.140 03/07/2021  ? ?Lab Results  ?Component Value Date  ? CHOL 183 03/07/2021  ? HDL 38 (L) 03/07/2021  ? LDLCALC 125 (H) 03/07/2021  ? TRIG 110 03/07/2021  ? ?Lab Results  ?Component Value Date  ? VD25OH 31.2 03/07/2021  ? ?Lab Results  ?Component Value Date  ? WBC 9.3 01/09/2020  ? HGB 13.7 01/09/2020  ? HCT 41.1 01/09/2020  ? MCV 86.7 01/09/2020  ? PLT 254 01/09/2020  ? ?No results found for: IRON, TIBC, FERRITIN ? ?Attestation Statements:  ? ?Reviewed by clinician on day of visit: allergies, medications, problem list, medical history, surgical history, family history, social history, and previous encounter notes. ? ? ?I, 03/08/2020, am acting as transcriptionist for Burt Knack, DO. ? ?I have reviewed the above documentation for accuracy and completeness, and I agree with the above. [employer], D.O. ? ?The 21st Century Cures Act was signed into law in 2016 which includes the topic of electronic health records.  This provides immediate access to information in MyChart.  This includes consultation notes, operative notes, office notes, lab results and pathology reports.  If you  have any questions about what you read please let 2017 know at your next visit so we can discuss your concerns and take corrective action if need be.  We are right here with you. ? ? ?

## 2021-05-28 ENCOUNTER — Ambulatory Visit (INDEPENDENT_AMBULATORY_CARE_PROVIDER_SITE_OTHER): Payer: BC Managed Care – PPO | Admitting: Family Medicine

## 2021-05-28 ENCOUNTER — Encounter (INDEPENDENT_AMBULATORY_CARE_PROVIDER_SITE_OTHER): Payer: Self-pay | Admitting: Family Medicine

## 2021-05-28 VITALS — BP 116/75 | HR 60 | Temp 97.8°F | Ht 61.0 in | Wt 160.0 lb

## 2021-05-28 DIAGNOSIS — E669 Obesity, unspecified: Secondary | ICD-10-CM | POA: Diagnosis not present

## 2021-05-28 DIAGNOSIS — R7303 Prediabetes: Secondary | ICD-10-CM | POA: Diagnosis not present

## 2021-05-28 DIAGNOSIS — Z6830 Body mass index (BMI) 30.0-30.9, adult: Secondary | ICD-10-CM | POA: Diagnosis not present

## 2021-05-28 DIAGNOSIS — E559 Vitamin D deficiency, unspecified: Secondary | ICD-10-CM

## 2021-05-28 MED ORDER — VITAMIN D (ERGOCALCIFEROL) 1.25 MG (50000 UNIT) PO CAPS: 50000.0000 [IU] | ORAL_CAPSULE | ORAL | 0 refills | Status: DC

## 2021-05-28 MED ORDER — METFORMIN HCL 500 MG PO TABS: 500.0000 mg | ORAL_TABLET | Freq: Two times a day (BID) | ORAL | 0 refills | Status: DC

## 2021-06-04 NOTE — Progress Notes (Unsigned)
Chief Complaint:   OBESITY Jill Mcintosh is here to discuss her progress with her obesity treatment plan along with follow-up of her obesity related diagnoses. Jill Mcintosh is on the Category 3 Plan and states she is following her eating plan approximately 95% of the time. Jill Mcintosh states she is walking 40 minutes 7 times per week.  Today's visit was #: 5 Starting weight: 184 lbs Starting date: 03/07/2021 Today's weight: 160 lbs Today's date: 06/04/2021 Total lbs lost to date: 24 Total lbs lost since last in-office visit: 5  Interim History: Jill Mcintosh has lost 5 pounds of fat mass and stayed the same in muscle mass and water weight. She is following the plan very well. Jill Mcintosh denies hunger or cravings.  Subjective:   1. Prediabetes- w sweets cravings Jill Mcintosh's cravings are well controlled and she denies hunger. She is tolerating medication(s) well without side effects. Medication compliance is good and patient appears to be taking it as prescribed. The patient denies additional concerns regarding this condition.   2. Vitamin D deficiency Jill Mcintosh's energy level is good. She is currently taking prescription vitamin D 50,000 IU each week. She denies nausea, vomiting or muscle weakness.  Lab Results  Component Value Date   VD25OH 31.2 03/07/2021   Assessment/Plan:  No orders of the defined types were placed in this encounter.   Medications Discontinued During This Encounter  Medication Reason   metFORMIN (GLUCOPHAGE) 500 MG tablet Reorder   Vitamin D, Ergocalciferol, (DRISDOL) 1.25 MG (50000 UNIT) CAPS capsule Reorder     Meds ordered this encounter  Medications   Vitamin D, Ergocalciferol, (DRISDOL) 1.25 MG (50000 UNIT) CAPS capsule    Sig: Take 1 capsule (50,000 Units total) by mouth every 7 (seven) days.    Dispense:  4 capsule    Refill:  0    30 d supply;  ** OV for RF **   Do not send RF request   metFORMIN (GLUCOPHAGE) 500 MG tablet    Sig: Take 1 tablet (500 mg total) by  mouth 2 (two) times daily with a meal. Lunch and dinner    Dispense:  60 tablet    Refill:  0    30 d supply;  ** OV for RF **   Do not send RF request     1. Prediabetes- w sweets cravings Jill Mcintosh agrees to continue taking metformin. Jill Mcintosh will continue to work on weight loss, exercise, and decreasing simple carbohydrates to help decrease the risk of diabetes.   - metFORMIN (GLUCOPHAGE) 500 MG tablet; Take 1 tablet (500 mg total) by mouth 2 (two) times daily with a meal. Lunch and dinner  Dispense: 60 tablet; Refill: 0  2. Vitamin D deficiency Low Vitamin D level contributes to fatigue and are associated with obesity, breast, and colon cancer. She agrees to continue to take prescription Vitamin D @50 ,000 IU every week and will follow-up for routine testing of Vitamin D, at least 2-3 times per year to avoid over-replacement.  - Vitamin D, Ergocalciferol, (DRISDOL) 1.25 MG (50000 UNIT) CAPS capsule; Take 1 capsule (50,000 Units total) by mouth every 7 (seven) days.  Dispense: 4 capsule; Refill: 0  3. Obesity, Current BMI 30.3 Jill Mcintosh was given Jill Mcintosh, Jill Mcintosh, and various other frozen treats that were reviewed with her as options for treats if desired.  Jill Mcintosh is currently in the action stage of change. As such, her goal is to continue with weight loss efforts. She has agreed to the Category 3 Plan.  Exercise goals:  As is.  Behavioral modification strategies: better snacking choices, avoiding temptations, and planning for success.  Jill Mcintosh has agreed to follow-up with our clinic in 2 weeks. She was informed of the importance of frequent follow-up visits to maximize her success with intensive lifestyle modifications for her multiple health conditions.   Objective:   Blood pressure 116/75, pulse 60, temperature 97.8 F (36.6 C), height 5\' 1"  (1.549 m), weight 160 lb (72.6 kg). Body mass index is 30.23 kg/m.  General: Cooperative, alert, well developed, in no acute  distress. HEENT: Conjunctivae and lids unremarkable. Cardiovascular: Regular rhythm.  Lungs: Normal work of breathing. Neurologic: No focal deficits.   Lab Results  Component Value Date   CREATININE 0.83 01/09/2020   BUN 11 01/09/2020   NA 134 (L) 01/09/2020   K 4.2 01/09/2020   CL 99 01/09/2020   CO2 24 01/09/2020   Lab Results  Component Value Date   ALT 54 (H) 01/09/2020   AST 37 01/09/2020   ALKPHOS 56 01/09/2020   BILITOT 0.8 01/09/2020   Lab Results  Component Value Date   HGBA1C 5.7 (H) 03/07/2021   HGBA1C 6.1 01/10/2021   Lab Results  Component Value Date   INSULIN 44.8 (H) 03/07/2021   Lab Results  Component Value Date   TSH 3.140 03/07/2021   Lab Results  Component Value Date   CHOL 183 03/07/2021   HDL 38 (L) 03/07/2021   LDLCALC 125 (H) 03/07/2021   TRIG 110 03/07/2021   Lab Results  Component Value Date   VD25OH 31.2 03/07/2021   Lab Results  Component Value Date   WBC 9.3 01/09/2020   HGB 13.7 01/09/2020   HCT 41.1 01/09/2020   MCV 86.7 01/09/2020   PLT 254 01/09/2020   No results found for: IRON, TIBC, FERRITIN  Attestation Statements:   Reviewed by clinician on day of visit: allergies, medications, problem list, medical history, surgical history, family history, social history, and previous encounter notes.  I03/02/2020, CMA, am acting as transcriptionist for Kirke Corin, DO  I have reviewed the above documentation for accuracy and completeness, and I agree with the above. [employer], D.O.  The 21st Century Cures Act was signed into law in 2016 which includes the topic of electronic health records.  This provides immediate access to information in MyChart.  This includes consultation notes, operative notes, office notes, lab results and pathology reports.  If you have any questions about what you read please let 2017 know at your next visit so we can discuss your concerns and take corrective action if need be.  We are  right here with you.

## 2021-06-11 ENCOUNTER — Encounter (INDEPENDENT_AMBULATORY_CARE_PROVIDER_SITE_OTHER): Payer: Self-pay | Admitting: Family Medicine

## 2021-06-11 ENCOUNTER — Ambulatory Visit (INDEPENDENT_AMBULATORY_CARE_PROVIDER_SITE_OTHER): Payer: BC Managed Care – PPO | Admitting: Family Medicine

## 2021-06-11 VITALS — BP 118/67 | HR 70 | Temp 98.5°F | Ht 61.0 in | Wt 155.0 lb

## 2021-06-11 DIAGNOSIS — R7303 Prediabetes: Secondary | ICD-10-CM

## 2021-06-11 DIAGNOSIS — E669 Obesity, unspecified: Secondary | ICD-10-CM

## 2021-06-11 DIAGNOSIS — E559 Vitamin D deficiency, unspecified: Secondary | ICD-10-CM | POA: Diagnosis not present

## 2021-06-11 DIAGNOSIS — Z6829 Body mass index (BMI) 29.0-29.9, adult: Secondary | ICD-10-CM | POA: Diagnosis not present

## 2021-06-11 MED ORDER — METFORMIN HCL 500 MG PO TABS: 500.0000 mg | ORAL_TABLET | Freq: Two times a day (BID) | ORAL | 0 refills | Status: DC

## 2021-06-11 MED ORDER — VITAMIN D (ERGOCALCIFEROL) 1.25 MG (50000 UNIT) PO CAPS
50000.0000 [IU] | ORAL_CAPSULE | ORAL | 0 refills | Status: DC
Start: 2021-06-11 — End: 2021-07-02
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
## 2021-07-02 ENCOUNTER — Ambulatory Visit (INDEPENDENT_AMBULATORY_CARE_PROVIDER_SITE_OTHER): Payer: BC Managed Care – PPO | Admitting: Family Medicine

## 2021-07-02 ENCOUNTER — Encounter (INDEPENDENT_AMBULATORY_CARE_PROVIDER_SITE_OTHER): Payer: Self-pay | Admitting: Family Medicine

## 2021-07-02 VITALS — BP 106/70 | HR 67 | Temp 98.1°F | Ht 61.0 in | Wt 151.0 lb

## 2021-07-02 DIAGNOSIS — E559 Vitamin D deficiency, unspecified: Secondary | ICD-10-CM

## 2021-07-02 DIAGNOSIS — E669 Obesity, unspecified: Secondary | ICD-10-CM

## 2021-07-02 DIAGNOSIS — R7303 Prediabetes: Secondary | ICD-10-CM | POA: Diagnosis not present

## 2021-07-02 DIAGNOSIS — Z6828 Body mass index (BMI) 28.0-28.9, adult: Secondary | ICD-10-CM

## 2021-07-02 MED ORDER — VITAMIN D (ERGOCALCIFEROL) 1.25 MG (50000 UNIT) PO CAPS
50000.0000 [IU] | ORAL_CAPSULE | ORAL | 0 refills | Status: DC
Start: 2021-07-02 — End: 2021-07-30

## 2021-07-02 MED ORDER — METFORMIN HCL 500 MG PO TABS: 500.0000 mg | ORAL_TABLET | Freq: Two times a day (BID) | ORAL | 0 refills | Status: DC

## 2021-07-03 LAB — VITAMIN D 25 HYDROXY (VIT D DEFICIENCY, FRACTURES): Vit D, 25-Hydroxy: 85.6 ng/mL (ref 30.0–100.0)

## 2021-07-03 LAB — HEMOGLOBIN A1C
Est. average glucose Bld gHb Est-mCnc: 105 mg/dL
Hgb A1c MFr Bld: 5.3 % (ref 4.8–5.6)

## 2021-07-04 NOTE — Progress Notes (Signed)
Chief Complaint:   OBESITY Jill Mcintosh is here to discuss her progress with her obesity treatment plan along with follow-up of her obesity related diagnoses. Jill Mcintosh is on the Category 3 Plan and states she is following her eating plan approximately 95% of the time. Jill Mcintosh states she is not currently exercising.  Today's visit was #: 7 Starting weight: 184 lbs Starting date: 03/07/2021 Today's weight: 151 lbs Today's date: 07/02/2021 Total lbs lost to date: 33 Total lbs lost since last in-office visit: 4  Interim History: Jill Mcintosh's mom is in a nursing and she is going to see her twice a day- recently had a stroke. Pt is following category 2 plan and denies cravings. She is not always getting all of her food in.  Subjective:   1. Prediabetes Jill Mcintosh's A1c 4 months ago was 5.7 and 6.1 prior to that.  2. Vitamin D deficiency She is currently taking prescription vitamin D 50,000 IU each week. She denies nausea, vomiting or muscle weakness.  Assessment/Plan:   Orders Placed This Encounter  Procedures   Hemoglobin A1c   VITAMIN D 25 Hydroxy (Vit-D Deficiency, Fractures)    Medications Discontinued During This Encounter  Medication Reason   metFORMIN (GLUCOPHAGE) 500 MG tablet Reorder   Vitamin D, Ergocalciferol, (DRISDOL) 1.25 MG (50000 UNIT) CAPS capsule Reorder     Meds ordered this encounter  Medications   metFORMIN (GLUCOPHAGE) 500 MG tablet    Sig: Take 1 tablet (500 mg total) by mouth 2 (two) times daily with a meal. Lunch and dinner    Dispense:  60 tablet    Refill:  0    30 d supply;  ** OV for RF **   Do not send RF request   Vitamin D, Ergocalciferol, (DRISDOL) 1.25 MG (50000 UNIT) CAPS capsule    Sig: Take 1 capsule (50,000 Units total) by mouth every 7 (seven) days.    Dispense:  4 capsule    Refill:  0    30 d supply;  ** OV for RF **   Do not send RF request     1. Prediabetes Jill Mcintosh will continue to work on weight loss, exercise, and decreasing simple  carbohydrates to help decrease the risk of diabetes. Obtain fasting labs today.  Refill- metFORMIN (GLUCOPHAGE) 500 MG tablet; Take 1 tablet (500 mg total) by mouth 2 (two) times daily with a meal. Lunch and dinner  Dispense: 60 tablet; Refill: 0  - Hemoglobin A1c  2. Vitamin D deficiency Low Vitamin D level contributes to fatigue and are associated with obesity, breast, and colon cancer. She agrees to continue to take prescription Vitamin D @50 ,000 IU every week and will follow-up for routine testing of Vitamin D, at least 2-3 times per year to avoid over-replacement. Obtain fasting labs today.  Refill- Vitamin D, Ergocalciferol, (DRISDOL) 1.25 MG (50000 UNIT) CAPS capsule; Take 1 capsule (50,000 Units total) by mouth every 7 (seven) days.  Dispense: 4 capsule; Refill: 0  - VITAMIN D 25 Hydroxy (Vit-D Deficiency, Fractures)  3. Obesity, Current BMI 28.6 Jill Mcintosh is currently in the action stage of change. As such, her goal is to continue with weight loss efforts. She has agreed to the Category 2 Plan with breakfast and lunch options.   Pack a lunch and bring cooler to the nursing home. Try to exercise everyday for stress management.  Exercise goals: All adults should avoid inactivity. Some physical activity is better than none, and adults who participate in any amount of  physical activity gain some health benefits.  Behavioral modification strategies: no skipping meals and planning for success.  Jill Mcintosh has agreed to follow-up with our clinic in 3-4 weeks. She was informed of the importance of frequent follow-up visits to maximize her success with intensive lifestyle modifications for her multiple health conditions.   Jill Mcintosh was informed we would discuss her lab results at her next visit unless there is a critical issue that needs to be addressed sooner. Jill Mcintosh agreed to keep her next visit at the agreed upon time to discuss these results.  Objective:   Blood pressure 106/70, pulse 67,  temperature 98.1 F (36.7 C), height 5\' 1"  (1.549 m), weight 151 lb (68.5 kg), SpO2 98 %. Body mass index is 28.53 kg/m.  General: Cooperative, alert, well developed, in no acute distress. HEENT: Conjunctivae and lids unremarkable. Cardiovascular: Regular rhythm.  Lungs: Normal work of breathing. Neurologic: No focal deficits.   Lab Results  Component Value Date   CREATININE 0.83 01/09/2020   BUN 11 01/09/2020   NA 134 (L) 01/09/2020   K 4.2 01/09/2020   CL 99 01/09/2020   CO2 24 01/09/2020   Lab Results  Component Value Date   ALT 54 (H) 01/09/2020   AST 37 01/09/2020   ALKPHOS 56 01/09/2020   BILITOT 0.8 01/09/2020   Lab Results  Component Value Date   HGBA1C 5.3 07/02/2021   HGBA1C 5.7 (H) 03/07/2021   HGBA1C 6.1 01/10/2021   Lab Results  Component Value Date   INSULIN 44.8 (H) 03/07/2021   Lab Results  Component Value Date   TSH 3.140 03/07/2021   Lab Results  Component Value Date   CHOL 183 03/07/2021   HDL 38 (L) 03/07/2021   LDLCALC 125 (H) 03/07/2021   TRIG 110 03/07/2021   Lab Results  Component Value Date   VD25OH 85.6 07/02/2021   VD25OH 31.2 03/07/2021   Lab Results  Component Value Date   WBC 9.3 01/09/2020   HGB 13.7 01/09/2020   HCT 41.1 01/09/2020   MCV 86.7 01/09/2020   PLT 254 01/09/2020   Attestation Statements:   Reviewed by clinician on day of visit: allergies, medications, problem list, medical history, surgical history, family history, social history, and previous encounter notes.  I, 03/08/2020, BS, CMA, am acting as transcriptionist for Kyung Rudd, DO.   I have reviewed the above documentation for accuracy and completeness, and I agree with the above. [employer], D.O.  The 21st Century Cures Act was signed into law in 2016 which includes the topic of electronic health records.  This provides immediate access to information in MyChart.  This includes consultation notes, operative notes, office notes, lab  results and pathology reports.  If you have any questions about what you read please let 2017 know at your next visit so we can discuss your concerns and take corrective action if need be.  We are right here with you.

## 2021-07-30 ENCOUNTER — Ambulatory Visit (INDEPENDENT_AMBULATORY_CARE_PROVIDER_SITE_OTHER): Payer: BC Managed Care – PPO | Admitting: Family Medicine

## 2021-07-30 ENCOUNTER — Encounter (INDEPENDENT_AMBULATORY_CARE_PROVIDER_SITE_OTHER): Payer: Self-pay | Admitting: Family Medicine

## 2021-07-30 VITALS — BP 140/84 | HR 70 | Temp 99.1°F | Ht 61.0 in | Wt 151.0 lb

## 2021-07-30 DIAGNOSIS — E669 Obesity, unspecified: Secondary | ICD-10-CM

## 2021-07-30 DIAGNOSIS — Z7984 Long term (current) use of oral hypoglycemic drugs: Secondary | ICD-10-CM

## 2021-07-30 DIAGNOSIS — R7303 Prediabetes: Secondary | ICD-10-CM | POA: Diagnosis not present

## 2021-07-30 DIAGNOSIS — E559 Vitamin D deficiency, unspecified: Secondary | ICD-10-CM

## 2021-07-30 DIAGNOSIS — Z6834 Body mass index (BMI) 34.0-34.9, adult: Secondary | ICD-10-CM

## 2021-07-30 DIAGNOSIS — Z6828 Body mass index (BMI) 28.0-28.9, adult: Secondary | ICD-10-CM

## 2021-07-30 DIAGNOSIS — F430 Acute stress reaction: Secondary | ICD-10-CM

## 2021-07-30 MED ORDER — VITAMIN D (ERGOCALCIFEROL) 1.25 MG (50000 UNIT) PO CAPS: ORAL_CAPSULE | ORAL | 0 refills | Status: DC

## 2021-07-30 MED ORDER — BUPROPION HCL ER (SR) 150 MG PO TB12: ORAL_TABLET | ORAL | 0 refills | Status: DC

## 2021-07-30 MED ORDER — METFORMIN HCL 500 MG PO TABS: 500.0000 mg | ORAL_TABLET | Freq: Two times a day (BID) | ORAL | 0 refills | Status: DC

## 2021-08-02 NOTE — Progress Notes (Signed)
Chief Complaint:   OBESITY Jill Mcintosh is here to discuss her progress with her obesity treatment plan along with follow-up of her obesity related diagnoses. Jill Mcintosh is on the Category 2 Plan with breakfast and lunch options and states she is following her eating plan approximately 60% of the time. Jill Mcintosh states she is not currently exercising.  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 She is eating whatever she cooks for her mom. Pt reports increased stress and increased cravings due to eating off plan more, worrying about her mom. Her to review labs.  Subjective:   1. Prediabetes Discussed labs with patient today. Jill Mcintosh's A1c went from 6.1 to now 5.3. She is tolerating Metformin well but doesn't feel it is working as well.  2. Vitamin D deficiency Discussed labs with patient today. Jill Mcintosh is tolerating medication(s) well without side effects.  Medication compliance is good as patient endorses taking it as prescribed.  The patient denies additional concerns regarding this condition.      3. Stress reaction Pt has no history of seizure disorder and no history of taking mood meds in the past. She is feeling overwhelmed by caring for her mom.  Assessment/Plan:  No orders of the defined types were placed in this encounter.   Medications Discontinued During This Encounter  Medication Reason   metFORMIN (GLUCOPHAGE) 500 MG tablet Reorder   Vitamin D, Ergocalciferol, (DRISDOL) 1.25 MG (50000 UNIT) CAPS capsule Reorder     Meds ordered this encounter  Medications   Vitamin D, Ergocalciferol, (DRISDOL) 1.25 MG (50000 UNIT) CAPS capsule    Sig: 1 po q 10 d    Dispense:  3 capsule    Refill:  0    30 d supply;  ** OV for RF **    Do not send RF request   metFORMIN (GLUCOPHAGE) 500 MG tablet    Sig: Take 1 tablet (500 mg total) by mouth 2 (two) times daily with a meal. Lunch and dinner    Dispense:  60 tablet    Refill:  0    30 d supply;  ** OV for RF **   Do not send RF request   buPROPion (WELLBUTRIN SR) 150 MG 12 hr tablet    Sig: 1 po daily at breakfast    Dispense:  30 tablet    Refill:  0     1. Prediabetes Jill Mcintosh will continue to work on weight loss, exercise, and decreasing simple carbohydrates to help decrease the risk of diabetes. Pt will discuss with insurance company if other meds may be covered.  Refill- metFORMIN (GLUCOPHAGE) 500 MG tablet; Take 1 tablet (500 mg total) by mouth 2 (two) times daily with a meal. Lunch and dinner  Dispense: 60 tablet; Refill: 0  2. Vitamin D deficiency Low Vitamin D level contributes to fatigue and are associated with obesity, breast, and colon cancer. She agrees to continue to take prescription Vitamin D @50 ,000 IU every 10 days and will follow-up for routine testing of Vitamin D, at least 2-3 times per year to avoid over-replacement.  Refill- Vitamin D, Ergocalciferol, (DRISDOL) 1.25 MG (50000 UNIT) CAPS capsule; 1 po q 10 d  Dispense: 3 capsule; Refill: 0  3. Stress reaction -Start  bupropion SR 150 mg. -Mindful eating handout provided to pt. -Self-care activities encouraged. -Use sleep meditation every night as needed to help with shutting mind off.  Start- buPROPion (WELLBUTRIN SR) 150 MG 12 hr tablet; 1 po daily at breakfast  Dispense: 30 tablet; Refill: 0  4. Obesity, Current BMI 28.6 Jill Mcintosh is currently in the action stage of change. As such, her goal is to continue with weight loss efforts. She has agreed to the Category 2 Plan with breakfast and lunch options.   Exercise goals: All adults should avoid inactivity. Some physical activity is better than none, and adults who participate in any amount of physical activity gain some health  benefits.  Behavioral modification strategies: increasing lean protein intake and planning for success.  Jill Mcintosh has agreed to follow-up with our clinic in 3-4 weeks. She was informed of the importance of frequent follow-up visits to maximize her success with intensive lifestyle modifications for her multiple health conditions.   Objective:   Blood pressure 140/84, pulse 70, temperature 99.1 F (37.3 C), height 5\' 1"  (1.549 m), weight 151 lb (68.5 kg), SpO2 98 %. Body mass index is 28.53 kg/m.  General: Cooperative, alert, well developed, in no acute distress. HEENT: Conjunctivae and lids unremarkable. Cardiovascular: Regular rhythm.  Lungs: Normal work of breathing. Neurologic: No focal deficits.   Lab Results  Component Value Date   CREATININE 0.83 01/09/2020   BUN 11 01/09/2020   NA 134 (L) 01/09/2020   K 4.2 01/09/2020   CL 99 01/09/2020   CO2 24 01/09/2020   Lab Results  Component Value Date   ALT 54 (H) 01/09/2020   AST 37 01/09/2020   ALKPHOS 56 01/09/2020   BILITOT 0.8 01/09/2020   Lab Results  Component Value Date   HGBA1C 5.3 07/02/2021   HGBA1C 5.7 (H) 03/07/2021   HGBA1C 6.1 01/10/2021   Lab Results  Component Value Date   INSULIN 44.8 (H) 03/07/2021   Lab Results  Component Value Date   TSH 3.140 03/07/2021   Lab Results  Component Value Date   CHOL 183 03/07/2021   HDL 38 (L) 03/07/2021   LDLCALC 125 (H) 03/07/2021   TRIG 110 03/07/2021   Lab Results  Component Value Date   VD25OH 85.6 07/02/2021   VD25OH 31.2 03/07/2021   Lab Results  Component Value Date   WBC 9.3 01/09/2020   HGB 13.7 01/09/2020   HCT 41.1 01/09/2020   MCV 86.7 01/09/2020   PLT 254 01/09/2020   Attestation Statements:   Reviewed by clinician on day of visit: allergies, medications, problem list, medical history, surgical history, family history, social history, and previous encounter notes.  I, 03/08/2020, BS, CMA, am acting as transcriptionist for Kyung Rudd, DO.    I have reviewed the above documentation for accuracy and completeness, and I agree with the above. [employer], D.O.  The 21st Century Cures Act was signed into law in 2016 which includes the topic of electronic health records.  This provides immediate access to information in MyChart.  This includes consultation notes, operative notes, office notes, lab results and pathology reports.  If you have any questions about what you read please let 2017 know at your next visit so we can discuss your concerns and take corrective action if need be.  We are right here with you.

## 2021-08-15 ENCOUNTER — Encounter (INDEPENDENT_AMBULATORY_CARE_PROVIDER_SITE_OTHER): Payer: Self-pay

## 2021-08-27 ENCOUNTER — Ambulatory Visit (INDEPENDENT_AMBULATORY_CARE_PROVIDER_SITE_OTHER): Payer: BC Managed Care – PPO | Admitting: Family Medicine

## 2021-08-27 ENCOUNTER — Encounter (INDEPENDENT_AMBULATORY_CARE_PROVIDER_SITE_OTHER): Payer: Self-pay | Admitting: Family Medicine

## 2021-08-27 VITALS — BP 131/72 | HR 60 | Temp 98.3°F | Ht 61.0 in | Wt 150.0 lb

## 2021-08-27 DIAGNOSIS — R7303 Prediabetes: Secondary | ICD-10-CM

## 2021-08-27 DIAGNOSIS — F430 Acute stress reaction: Secondary | ICD-10-CM | POA: Diagnosis not present

## 2021-08-27 DIAGNOSIS — E559 Vitamin D deficiency, unspecified: Secondary | ICD-10-CM | POA: Diagnosis not present

## 2021-08-27 DIAGNOSIS — Z6828 Body mass index (BMI) 28.0-28.9, adult: Secondary | ICD-10-CM

## 2021-08-27 DIAGNOSIS — K5909 Other constipation: Secondary | ICD-10-CM

## 2021-08-27 DIAGNOSIS — Z7984 Long term (current) use of oral hypoglycemic drugs: Secondary | ICD-10-CM

## 2021-08-27 DIAGNOSIS — E669 Obesity, unspecified: Secondary | ICD-10-CM

## 2021-08-27 MED ORDER — BUPROPION HCL ER (SR) 150 MG PO TB12: ORAL_TABLET | ORAL | 0 refills | Status: DC

## 2021-08-27 MED ORDER — POLYETHYLENE GLYCOL 3350 17 GM/SCOOP PO POWD: 17.0000 g | Freq: Every day | ORAL | 1 refills | Status: DC

## 2021-08-27 MED ORDER — VITAMIN D (ERGOCALCIFEROL) 1.25 MG (50000 UNIT) PO CAPS
ORAL_CAPSULE | ORAL | 0 refills | Status: DC
Start: 2021-08-27 — End: 2021-10-01

## 2021-08-27 MED ORDER — SEMAGLUTIDE-WEIGHT MANAGEMENT 0.25 MG/0.5ML ~~LOC~~ SOAJ: 0.2500 mg | SUBCUTANEOUS | 0 refills | Status: AC

## 2021-08-27 MED ORDER — METFORMIN HCL 500 MG PO TABS: 500.0000 mg | ORAL_TABLET | Freq: Two times a day (BID) | ORAL | 0 refills | Status: DC

## 2021-08-28 ENCOUNTER — Encounter (INDEPENDENT_AMBULATORY_CARE_PROVIDER_SITE_OTHER): Payer: Self-pay | Admitting: Family Medicine

## 2021-08-29 ENCOUNTER — Telehealth (INDEPENDENT_AMBULATORY_CARE_PROVIDER_SITE_OTHER): Payer: Self-pay | Admitting: Family Medicine

## 2021-08-29 ENCOUNTER — Encounter (INDEPENDENT_AMBULATORY_CARE_PROVIDER_SITE_OTHER): Payer: Self-pay

## 2021-08-29 NOTE — Telephone Encounter (Signed)
Dr. Sharee Holster - Prior authorization approved for [id number]. Effective: 08/28/2021 - 03/29/2022. Patient sent approval message via mychart.

## 2021-08-29 NOTE — Telephone Encounter (Signed)
Please review

## 2021-08-30 ENCOUNTER — Other Ambulatory Visit (INDEPENDENT_AMBULATORY_CARE_PROVIDER_SITE_OTHER): Payer: Self-pay

## 2021-08-30 MED ORDER — SAXENDA 18 MG/3ML ~~LOC~~ SOPN: PEN_INJECTOR | SUBCUTANEOUS | 0 refills | Status: AC

## 2021-09-02 NOTE — Progress Notes (Unsigned)
Chief Complaint:   OBESITY Jill Mcintosh is here to discuss her progress with her obesity treatment plan along with follow-up of her obesity related diagnoses. Jill Mcintosh is on the Category 2 Plan with breakfast options and states she is following her eating plan approximately 50% of the time. Jill Mcintosh states she is walking and doing yard work 20-45 minutes 3-5 times per week.  Today's visit was #: 9 Starting weight: 184 lbs Starting date: 03/07/2021 Today's weight: 150 lbs Today's date: 08/27/2021 Total lbs lost to date: 34 Total lbs lost since last in-office visit: 1  Interim History: Jill Mcintosh is here for a follow up office visit.  We reviewed her meal plan and all questions were answered.  Patient's food recall appears to be accurate and consistent with what is on plan when she is following it.   When eating on plan, her hunger and cravings are well controlled.    Subjective:   1. Prediabetes [name] does not feel the Metformin twice a day is helping much. She still has cravings, especially for chocolate cupcakes. One of pt's colleagues is on Wegovy, and pt desires it as well.  2. Stress reaction She is sleeping better than prior. We started her on Wellbutrin at her last OV, and she is tolerating it well but is not sure there's much difference with stress yet.  3. Vitamin D deficiency She is currently taking prescription vitamin D 50,000 IU every 10 days. She denies nausea, vomiting or muscle weakness.  4. Other constipation Pt reports she has been constipated.  Assessment/Plan:  No orders of the defined types were placed in this encounter.   Medications Discontinued During This Encounter  Medication Reason   Vitamin D, Ergocalciferol, (DRISDOL) 1.25 MG (50000 UNIT) CAPS capsule Reorder   metFORMIN (GLUCOPHAGE) 500 MG tablet Reorder   buPROPion (WELLBUTRIN SR) 150 MG 12 hr tablet Reorder     Meds ordered this encounter  Medications   buPROPion (WELLBUTRIN SR) 150 MG  12 hr tablet    Sig: 1 po daily at breakfast    Dispense:  30 tablet    Refill:  0   Vitamin D, Ergocalciferol, (DRISDOL) 1.25 MG (50000 UNIT) CAPS capsule    Sig: 1 po q 10 d    Dispense:  3 capsule    Refill:  0    30 d supply;  ** OV for RF **   Do not send RF request   metFORMIN (GLUCOPHAGE) 500 MG tablet    Sig: Take 1 tablet (500 mg total) by mouth 2 (two) times daily with a meal. Lunch and dinner    Dispense:  60 tablet    Refill:  0    30 d supply;  ** OV for RF **   Do not send RF request   Semaglutide-Weight Management 0.25 MG/0.5ML SOAJ    Sig: Inject 0.25 mg into the skin once a week. Every thursday    Dispense:  2 mL    Refill:  0   polyethylene glycol powder (GLYCOLAX/MIRALAX) 17 GM/SCOOP powder    Sig: Take 17 g by mouth daily. Or twice daily if needed    Dispense:  850 g    Refill:  1   Liraglutide -Weight Management (SAXENDA) 18 MG/3ML SOPN    Sig: Inject 0.6 mg into the skin daily for 14 days, THEN 1.2 mg daily for 14 days.    Dispense:  6 mL    Refill:  0  1. Prediabetes [name] will continue to work on weight loss, exercise, and decreasing simple carbohydrates to help decrease the risk of diabetes.  Continue Metformin. Start [place] after risks and benefits discussed with pt. No contraindications of medication, family history of medullary thyroid cancer, or personal history of pancreatitis.  Refill- metFORMIN (GLUCOPHAGE) 500 MG tablet; Take 1 tablet (500 mg total) by mouth 2 (two) times daily with a meal. Lunch and dinner  Dispense: 60 tablet; Refill: 0 Start- Semaglutide-Weight Management 0.25 MG/0.5ML SOAJ; Inject 0.25 mg into the skin once a week. Every Thursday  Dispense: 2 mL; Refill: 0  2. Stress reaction Continue Metformin and self-care activities.  Refill- buPROPion (WELLBUTRIN SR) 150 MG 12 hr tablet; 1 po daily at breakfast  Dispense: 30 tablet; Refill: 0  3. Vitamin D deficiency Low Vitamin D level contributes to fatigue and are associated  with obesity, breast, and colon cancer. She agrees to continue to take prescription Vitamin D @50 ,000 IU every 10 days and will follow-up for routine testing of Vitamin D, at least 2-3 times per year to avoid over-replacement.  Refill- Vitamin D, Ergocalciferol, (DRISDOL) 1.25 MG (50000 UNIT) CAPS capsule; 1 po q 10 d  Dispense: 3 capsule; Refill: 0  4. Other constipation PLAN:  Start Miralax daily or twice a day until bowel movements ar regular, then back to once daily or as needed. Pt cautioned/advised that symptoms will worsen with addition of Wegovy. Increase fiber intake and water intake to 80 oz a day. [name] was informed that a decrease in bowel movement frequency is normal while losing weight, but stools should not be hard or painful.  - Patient advised to begin MiraLAX once daily or every other day titrated for one softer, normal caliber bowel movement each day.    - Discussed addition of laxatives such as Peri-colace and Colace as needed for refractory constipation.   - Adequate daily water intake encouraged along with activity/ movement  - Bowel habits and good bowel hygiene discussed with patient  - F/up with your primary care provider if W or NI to rule out causes for constipation besides nutrition  Additional Counseling-  Good Bowel Health: Your goal is to have one soft bowel movement each day. Drink at least half of your weight in ounces of water per day unless otherwise noted by one of your doctors that you must restrict water intake.  Eat plenty of fiber (goal is over 25 grams each day).  It is best to get most of your fiber from dietary sources which includes leafy green vegetables, fresh fruit, and whole grains.  You may need to add fiber with the help of OTC fiber supplements.  These include the likes of Metamucil, Citrucel, Psyllium husks and Flaxseed.  If you are still having trouble, try adding Fleet's enemas or Magnesium Citrate. If all of these changes do not work, contact  your PCP.  Start- polyethylene glycol powder (GLYCOLAX/MIRALAX) 17 GM/SCOOP powder; Take 17 g by mouth daily. Or twice daily if needed  Dispense: 850 g; Refill: 1  5. Obesity, Current BMI 28.3 Jill Mcintosh is currently in the action stage of change. As such, her goal is to continue with weight loss efforts. She has agreed to the Category 2 Plan with breakfast options.   Exercise goals:  As is  Behavioral modification strategies: no skipping meals.  Tyreka has agreed to follow-up with our clinic in 3-4 weeks. She was informed of the importance of frequent follow-up visits to maximize her success with  intensive lifestyle modifications for her multiple health conditions.   Objective:   Blood pressure 131/72, pulse 60, temperature 98.3 F (36.8 C), height 5\' 1"  (1.549 m), weight 150 lb (68 kg), SpO2 98 %. Body mass index is 28.34 kg/m.  General: Cooperative, alert, well developed, in no acute distress. HEENT: Conjunctivae and lids unremarkable. Cardiovascular: Regular rhythm.  Lungs: Normal work of breathing. Neurologic: No focal deficits.   Lab Results  Component Value Date   CREATININE 0.83 01/09/2020   BUN 11 01/09/2020   NA 134 (L) 01/09/2020   K 4.2 01/09/2020   CL 99 01/09/2020   CO2 24 01/09/2020   Lab Results  Component Value Date   ALT 54 (H) 01/09/2020   AST 37 01/09/2020   ALKPHOS 56 01/09/2020   BILITOT 0.8 01/09/2020   Lab Results  Component Value Date   HGBA1C 5.3 07/02/2021   HGBA1C 5.7 (H) 03/07/2021   HGBA1C 6.1 01/10/2021   Lab Results  Component Value Date   INSULIN 44.8 (H) 03/07/2021   Lab Results  Component Value Date   TSH 3.140 03/07/2021   Lab Results  Component Value Date   CHOL 183 03/07/2021   HDL 38 (L) 03/07/2021   LDLCALC 125 (H) 03/07/2021   TRIG 110 03/07/2021   Lab Results  Component Value Date   VD25OH 85.6 07/02/2021   VD25OH 31.2 03/07/2021   Lab Results  Component Value Date   WBC 9.3 01/09/2020   HGB 13.7 01/09/2020    HCT 41.1 01/09/2020   MCV 86.7 01/09/2020   PLT 254 01/09/2020    Attestation Statements:   Reviewed by clinician on day of visit: allergies, medications, problem list, medical history, surgical history, family history, social history, and previous encounter notes.  I, 03/08/2020, BS, CMA, am acting as transcriptionist for Kyung Rudd, DO.   I have reviewed the above documentation for accuracy and completeness, and I agree with the above. [employer], D.O.  The 21st Century Cures Act was signed into law in 2016 which includes the topic of electronic health records.  This provides immediate access to information in MyChart.  This includes consultation notes, operative notes, office notes, lab results and pathology reports.  If you have any questions about what you read please let 2017 know at your next visit so we can discuss your concerns and take corrective action if need be.  We are right here with you.

## 2021-09-04 ENCOUNTER — Telehealth (INDEPENDENT_AMBULATORY_CARE_PROVIDER_SITE_OTHER): Payer: Self-pay | Admitting: Family Medicine

## 2021-09-04 ENCOUNTER — Encounter (INDEPENDENT_AMBULATORY_CARE_PROVIDER_SITE_OTHER): Payer: Self-pay

## 2021-09-04 NOTE — Telephone Encounter (Signed)
Dr. Sharee Holster - Prior authorization approved for Saxenda. Effective: 09/03/2021 - 04/04/2022. Patient sent approval message via mychart.

## 2021-10-01 ENCOUNTER — Encounter (INDEPENDENT_AMBULATORY_CARE_PROVIDER_SITE_OTHER): Payer: Self-pay | Admitting: Family Medicine

## 2021-10-01 ENCOUNTER — Ambulatory Visit (INDEPENDENT_AMBULATORY_CARE_PROVIDER_SITE_OTHER): Payer: BC Managed Care – PPO | Admitting: Family Medicine

## 2021-10-01 VITALS — BP 130/78 | HR 63 | Temp 98.1°F | Ht 61.0 in | Wt 151.0 lb

## 2021-10-01 DIAGNOSIS — E669 Obesity, unspecified: Secondary | ICD-10-CM

## 2021-10-01 DIAGNOSIS — E559 Vitamin D deficiency, unspecified: Secondary | ICD-10-CM | POA: Diagnosis not present

## 2021-10-01 DIAGNOSIS — F430 Acute stress reaction: Secondary | ICD-10-CM | POA: Diagnosis not present

## 2021-10-01 DIAGNOSIS — R7303 Prediabetes: Secondary | ICD-10-CM

## 2021-10-01 DIAGNOSIS — Z6828 Body mass index (BMI) 28.0-28.9, adult: Secondary | ICD-10-CM

## 2021-10-01 MED ORDER — VITAMIN D (ERGOCALCIFEROL) 1.25 MG (50000 UNIT) PO CAPS: ORAL_CAPSULE | ORAL | 0 refills | Status: DC

## 2021-10-01 MED ORDER — BUPROPION HCL ER (SR) 150 MG PO TB12: ORAL_TABLET | ORAL | 0 refills | Status: DC

## 2021-10-01 MED ORDER — SEMAGLUTIDE-WEIGHT MANAGEMENT 0.5 MG/0.5ML ~~LOC~~ SOAJ: 0.5000 mg | SUBCUTANEOUS | 0 refills | Status: AC

## 2021-10-01 MED ORDER — METFORMIN HCL 500 MG PO TABS: 500.0000 mg | ORAL_TABLET | Freq: Two times a day (BID) | ORAL | 0 refills | Status: DC

## 2021-10-03 NOTE — Progress Notes (Signed)
Chief Complaint:   OBESITY Jill Mcintosh is here to discuss her progress with her obesity treatment plan along with follow-up of her obesity related diagnoses. Jill Mcintosh is on the Category 2 Plan with breakfast options and states she is following her eating plan approximately 50% of the time. Jill Mcintosh states she is walking and doing house work 120 minutes 7 times per week.  Today's visit was #: 10 Starting weight: 184 lbs Starting date: 03/07/2021 Today's weight: 151 lbs Today's date: 10/01/2021 Total lbs lost to date: 33 Total lbs lost since last in-office visit: +1  Interim History: Jill Mcintosh can't find weight loss meds, Wegovy or Saxenda. 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 Pt is still skipping meals at times. Still a lot of stress.  Subjective:   1. Stress reaction Stable, but pt needs a break from caring for her mom everyday.  2. Prediabetes Jill Mcintosh has a diagnosis of prediabetes based on her elevated HgA1c and was informed this puts her at greater risk of developing diabetes. She continues to work on diet and exercise to decrease her risk of diabetes. She denies nausea or hypoglycemia.  3. Vitamin D deficiency She is currently taking prescription vitamin D 50,000 IU every 10 days. She denies nausea, vomiting or muscle weakness.  Assessment/Plan:  No orders of the defined types were placed in this encounter.   Medications Discontinued During This Encounter  Medication Reason   buPROPion (WELLBUTRIN SR) 150 MG 12 hr tablet Reorder   Vitamin D, Ergocalciferol, (DRISDOL) 1.25 MG (50000 UNIT) CAPS capsule Reorder   metFORMIN (GLUCOPHAGE) 500 MG tablet Reorder     Meds ordered this encounter  Medications   buPROPion (WELLBUTRIN SR) 150 MG 12 hr tablet    Sig: 1 po daily at breakfast    Dispense:  30 tablet    Refill:  0   metFORMIN (GLUCOPHAGE) 500 MG tablet    Sig: Take 1 tablet (500 mg total) by mouth 2 (two) times daily with a  meal. Lunch and dinner    Dispense:  60 tablet    Refill:  0    30 d supply;  ** OV for RF **   Do not send RF request   Vitamin D, Ergocalciferol, (DRISDOL) 1.25 MG (50000 UNIT) CAPS capsule    Sig: 1 po q 10 d    Dispense:  3 capsule    Refill:  0    30 d supply;  ** OV for RF **   Do not send RF request   Semaglutide-Weight Management 0.5 MG/0.5ML SOAJ    Sig: Inject 0.5 mg into the skin once a week. Every thursday    Dispense:  2 mL    Refill:  0     1. Stress reaction Discussed how thoughts affect eating habits, modeling of thoughts, feelings, and behaviors, and strategies for change.  Importance of not skipping meals and getting all her proteins and fiber in on a daily basis discussed.   - Discussed cognitive distortions, coping thoughts, and how to change our thoughts/ self talk regarding foods/ eating patterns. - No SI/ HI.  Mood stable currently - Behavior modification techniques were discussed today to help deal with emotional/ non-hunger eating behaviors including but not limited to exercise for stress management, meditation/prayer, behavorial sessions with her therapist and self care activities like adequate sleep (7-9 hrs/nite). - Importance of following up with PCP and others was stressed  - Educational  handouts given to pt at their request - Reminded patient of the importance of following their prudent nutrition plan and how food can affect mood as well to support emotional wellbeing.  - We will continue to monitor closely alongside PCP / other specialists. Continue current treatment plan.  Refill- buPROPion (WELLBUTRIN SR) 150 MG 12 hr tablet; 1 po daily at breakfast  Dispense: 30 tablet; Refill: 0  2. Prediabetes Jill Mcintosh will continue to work on weight loss, exercise, and decreasing simple carbohydrates to help decrease the risk of diabetes.   Refill- metFORMIN (GLUCOPHAGE) 500 MG tablet; Take 1 tablet (500 mg total) by mouth 2 (two) times daily with a meal. Lunch and  dinner  Dispense: 60 tablet; Refill: 0  3. Vitamin D deficiency Low Vitamin D level contributes to fatigue and are associated with obesity, breast, and colon cancer. She agrees to continue to take prescription Vitamin D @50 ,000 IU every 10 days and will follow-up for routine testing of Vitamin D, at least 2-3 times per year to avoid over-replacement.  Refill- Vitamin D, Ergocalciferol, (DRISDOL) 1.25 MG (50000 UNIT) CAPS capsule; 1 po q 10 d  Dispense: 3 capsule; Refill: 0  4. Obesity, current BMI 28.7 Jill Mcintosh is currently in the action stage of change. As such, her goal is to continue with weight loss efforts. She has agreed to the Category 2 Plan with breakfast options.   Prior approval via insurance through 3/24. Risks and benefits of Wegovy discussed with pt. No contraindications. We discussed various medication options to help Jill Mcintosh with her weight loss efforts and we both agreed to start Wegovy 0.5 mg.  Start- Semaglutide-Weight Management 0.5 MG/0.5ML SOAJ; Inject 0.5 mg into the skin once a week. Every Thursday  Dispense: 2 mL; Refill: 0  Exercise goals:  As is  Behavioral modification strategies: increasing lean protein intake, decreasing simple carbohydrates, and planning for success.  Jill Mcintosh has agreed to follow-up with our clinic in 3 weeks. She was informed of the importance of frequent follow-up visits to maximize her success with intensive lifestyle modifications for her multiple health conditions.   Objective:   Blood pressure 130/78, pulse 63, temperature 98.1 F (36.7 C), height 5\' 1"  (1.549 m), weight 151 lb (68.5 kg), SpO2 99 %. Body mass index is 28.53 kg/m.  General: Cooperative, alert, well developed, in no acute distress. HEENT: Conjunctivae and lids unremarkable. Cardiovascular: Regular rhythm.  Lungs: Normal work of breathing. Neurologic: No focal deficits.   Lab Results  Component Value Date   CREATININE 0.83 01/09/2020   BUN 11 01/09/2020   NA 134  (L) 01/09/2020   K 4.2 01/09/2020   CL 99 01/09/2020   CO2 24 01/09/2020   Lab Results  Component Value Date   ALT 54 (H) 01/09/2020   AST 37 01/09/2020   ALKPHOS 56 01/09/2020   BILITOT 0.8 01/09/2020   Lab Results  Component Value Date   HGBA1C 5.3 07/02/2021   HGBA1C 5.7 (H) 03/07/2021   HGBA1C 6.1 01/10/2021   Lab Results  Component Value Date   INSULIN 44.8 (H) 03/07/2021   Lab Results  Component Value Date   TSH 3.140 03/07/2021   Lab Results  Component Value Date   CHOL 183 03/07/2021   HDL 38 (L) 03/07/2021   LDLCALC 125 (H) 03/07/2021   TRIG 110 03/07/2021   Lab Results  Component Value Date   VD25OH 85.6 07/02/2021   VD25OH 31.2 03/07/2021   Lab Results  Component Value Date   WBC 9.3  01/09/2020   HGB 13.7 01/09/2020   HCT 41.1 01/09/2020   MCV 86.7 01/09/2020   PLT 254 01/09/2020   Attestation Statements:   Reviewed by clinician on day of visit: allergies, medications, problem list, medical history, surgical history, family history, social history, and previous encounter notes.  I, Kyung Rudd, BS, CMA, am acting as transcriptionist for [employer], DO.  I have reviewed the above documentation for accuracy and completeness, and I agree with the above. Carlye Grippe, D.O.  The 21st Century Cures Act was signed into law in 2016 which includes the topic of electronic health records.  This provides immediate access to information in MyChart.  This includes consultation notes, operative notes, office notes, lab results and pathology reports.  If you have any questions about what you read please let us know at your next visit so we can discuss your concerns and take corrective action if need be.  We are right here with you.

## 2021-10-15 ENCOUNTER — Encounter (INDEPENDENT_AMBULATORY_CARE_PROVIDER_SITE_OTHER): Payer: Self-pay | Admitting: Family Medicine

## 2021-10-15 ENCOUNTER — Ambulatory Visit (INDEPENDENT_AMBULATORY_CARE_PROVIDER_SITE_OTHER): Payer: BC Managed Care – PPO | Admitting: Family Medicine

## 2021-10-15 VITALS — BP 136/74 | HR 73 | Temp 98.1°F | Ht 61.0 in | Wt 154.0 lb

## 2021-10-15 DIAGNOSIS — E559 Vitamin D deficiency, unspecified: Secondary | ICD-10-CM

## 2021-10-15 DIAGNOSIS — F430 Acute stress reaction: Secondary | ICD-10-CM | POA: Diagnosis not present

## 2021-10-15 DIAGNOSIS — E669 Obesity, unspecified: Secondary | ICD-10-CM

## 2021-10-15 DIAGNOSIS — R638 Other symptoms and signs concerning food and fluid intake: Secondary | ICD-10-CM

## 2021-10-15 DIAGNOSIS — R7303 Prediabetes: Secondary | ICD-10-CM | POA: Diagnosis not present

## 2021-10-15 DIAGNOSIS — Z6829 Body mass index (BMI) 29.0-29.9, adult: Secondary | ICD-10-CM

## 2021-10-15 MED ORDER — VITAMIN D (ERGOCALCIFEROL) 1.25 MG (50000 UNIT) PO CAPS: ORAL_CAPSULE | ORAL | 0 refills | Status: DC

## 2021-10-15 MED ORDER — BUPROPION HCL ER (SR) 150 MG PO TB12: ORAL_TABLET | ORAL | 0 refills | Status: DC

## 2021-10-15 MED ORDER — METFORMIN HCL 500 MG PO TABS: 500.0000 mg | ORAL_TABLET | Freq: Two times a day (BID) | ORAL | 0 refills | Status: DC

## 2021-10-15 MED ORDER — TOPIRAMATE ER 25 MG PO CAP24: ORAL_CAPSULE | ORAL | 0 refills | Status: DC

## 2021-10-22 ENCOUNTER — Ambulatory Visit (INDEPENDENT_AMBULATORY_CARE_PROVIDER_SITE_OTHER): Payer: BC Managed Care – PPO | Admitting: Family Medicine

## 2021-10-22 NOTE — Progress Notes (Unsigned)
Chief Complaint:   OBESITY Jill Mcintosh is here to discuss her progress with her obesity treatment plan along with follow-up of her obesity related diagnoses. Jill Mcintosh is on the Category 2 Plan with breakfast options and states she is following her eating plan approximately 30-40% of the time. Jill Mcintosh states she is walking 20 minutes 3-4 times per week.  Today's visit was #: 11 Starting weight: 184 lbs Starting date: 03/07/2021 Today's weight: 154 lbs Today's date: 10/15/2021 Total lbs lost to date: 30 Total lbs lost since last in-office visit: +3  Interim History: The last couple of weeks have been very challenging for Jill Mcintosh. Her mom is "acting crazy." [place] Mcint[place]. Jill Mcintosh can't find Saxenda or Wegovy at any pharmacies at low doses. She would like to consider additional meds.  Subjective:   1. Craving for particular food Unable ***??  2. Vitamin D deficiency She is currently taking prescription vitamin D 50,000 IU each week. She denies nausea, vomiting or muscle weakness.  3. Stress reaction Stable depression and anxiety. Jill Mcintosh reports more stress with her mom lately.  4. Prediabetes Lately, Jill Mcintosh has been eating more carbs but doing more stress/emotional eating due to Mom thinking Jill Mcintosh is stealing from her and trying to poison her.  Assessment/Plan:   1. Craving for particular food Start Trokendi 25 mg once a day. See prescription below.  Start- Topiramate ER (TROKENDI XR) 25 MG CP24; 1 po q am  Dispense: 30 capsule; Refill: 0  2. Vitamin D deficiency Low Vitamin D level contributes to fatigue and are associated with obesity, breast, and colon cancer. She agrees to continue to take prescription Vitamin D @50 ,000 IU every 10 days and will follow-up for routine testing of Vitamin D, at least 2-3 times per year to avoid over-replacement.  Refill- Vitamin D, Ergocalciferol, (DRISDOL) 1.25 MG (50000 UNIT) CAPS capsule; 1 po q 10 d  Dispense: 3 capsule; Refill:  0  3. Stress reaction Behavior modification techniques were discussed today to help Jill deal with her emotional/non-hunger eating behaviors.  Orders and follow up as documented in patient record.   Refill- buPROPion (WELLBUTRIN SR) 150 MG 12 hr tablet; 1 po daily at breakfast  Dispense: 30 tablet; Refill: 0  4. Prediabetes Jill Mcintosh will continue to work on weight loss, exercise, and decreasing simple carbohydrates to help decrease the risk of diabetes.   Refill- metFORMIN (GLUCOPHAGE) 500 MG tablet; Take 1 tablet (500 mg total) by mouth 2 (two) times daily with a meal. Lunch and dinner  Dispense: 60 tablet; Refill: 0  5. Obesity, current BMI 29.1 Jill Mcintosh is currently in the action stage of change. As such, her goal is to continue with weight loss efforts. She has agreed to the Category 2 Plan with breakfast options.   Exercise goals:  As is  Behavioral modification strategies: increasing lean protein intake, decreasing simple carbohydrates, better snacking choices, and emotional eating strategies.  Mcintosh has agreed to follow-up with our clinic in 3-4 weeks as scheduled (will consider lab draw). She was informed of the importance of frequent follow-up visits to maximize her success with intensive lifestyle modifications for her multiple health conditions.   Objective:   Blood pressure 136/74, pulse 73, temperature 98.1 F (36.7 C), height 5\' 1"  (1.549 m), weight 154 lb (69.9 kg), SpO2 97 %. Body mass index is 29.1 kg/m.  General: Cooperative, alert, well developed, in no acute distress. HEENT: Conjunctivae and lids unremarkable. Cardiovascular: Regular  rhythm.  Lungs: Normal work of breathing. Neurologic: No focal deficits.   Lab Results  Component Value Date   CREATININE 0.83 01/09/2020   BUN 11 01/09/2020   NA 134 (L) 01/09/2020   K 4.2 01/09/2020   CL 99 01/09/2020   CO2 24 01/09/2020   Lab Results  Component Value Date   ALT 54 (H) 01/09/2020   AST 37 01/09/2020    ALKPHOS 56 01/09/2020   BILITOT 0.8 01/09/2020   Lab Results  Component Value Date   HGBA1C 5.3 07/02/2021   HGBA1C 5.7 (H) 03/07/2021   HGBA1C 6.1 01/10/2021   Lab Results  Component Value Date   INSULIN 44.8 (H) 03/07/2021   Lab Results  Component Value Date   TSH 3.140 03/07/2021   Lab Results  Component Value Date   CHOL 183 03/07/2021   HDL 38 (L) 03/07/2021   LDLCALC 125 (H) 03/07/2021   TRIG 110 03/07/2021   Lab Results  Component Value Date   VD25OH 85.6 07/02/2021   VD25OH 31.2 03/07/2021   Lab Results  Component Value Date   WBC 9.3 01/09/2020   HGB 13.7 01/09/2020   HCT 41.1 01/09/2020   MCV 86.7 01/09/2020   PLT 254 01/09/2020    Attestation Statements:   Reviewed by clinician on day of visit: allergies, medications, problem list, medical history, surgical history, family history, social history, and previous encounter notes.  I, Kyung Rudd, BS, CMA, am acting as transcriptionist for [employer], DO.  I have reviewed the above documentation for accuracy and completeness, and I agree with the above. -  ***

## 2021-11-12 ENCOUNTER — Ambulatory Visit (INDEPENDENT_AMBULATORY_CARE_PROVIDER_SITE_OTHER): Payer: BC Managed Care – PPO | Admitting: Family Medicine

## 2021-11-21 ENCOUNTER — Ambulatory Visit (INDEPENDENT_AMBULATORY_CARE_PROVIDER_SITE_OTHER): Payer: BC Managed Care – PPO | Admitting: Family Medicine

## 2021-11-21 ENCOUNTER — Encounter (INDEPENDENT_AMBULATORY_CARE_PROVIDER_SITE_OTHER): Payer: Self-pay | Admitting: Family Medicine

## 2021-11-21 VITALS — BP 132/74 | HR 61 | Temp 98.0°F | Ht 61.0 in | Wt 158.0 lb

## 2021-11-21 DIAGNOSIS — R638 Other symptoms and signs concerning food and fluid intake: Secondary | ICD-10-CM | POA: Diagnosis not present

## 2021-11-21 DIAGNOSIS — R7303 Prediabetes: Secondary | ICD-10-CM | POA: Diagnosis not present

## 2021-11-21 DIAGNOSIS — E559 Vitamin D deficiency, unspecified: Secondary | ICD-10-CM | POA: Diagnosis not present

## 2021-11-21 DIAGNOSIS — E669 Obesity, unspecified: Secondary | ICD-10-CM

## 2021-11-21 DIAGNOSIS — E66811 Obesity, class 1: Secondary | ICD-10-CM

## 2021-11-21 DIAGNOSIS — F430 Acute stress reaction: Secondary | ICD-10-CM | POA: Diagnosis not present

## 2021-11-21 DIAGNOSIS — Z6829 Body mass index (BMI) 29.0-29.9, adult: Secondary | ICD-10-CM

## 2021-11-21 MED ORDER — METFORMIN HCL 500 MG PO TABS: 500.0000 mg | ORAL_TABLET | Freq: Two times a day (BID) | ORAL | 0 refills | Status: DC

## 2021-11-21 MED ORDER — VITAMIN D (ERGOCALCIFEROL) 1.25 MG (50000 UNIT) PO CAPS: ORAL_CAPSULE | ORAL | 0 refills | Status: DC

## 2021-11-21 MED ORDER — BUPROPION HCL ER (SR) 150 MG PO TB12: ORAL_TABLET | ORAL | 0 refills | Status: DC

## 2021-11-22 DIAGNOSIS — R638 Other symptoms and signs concerning food and fluid intake: Secondary | ICD-10-CM | POA: Insufficient documentation

## 2021-11-22 DIAGNOSIS — E669 Obesity, unspecified: Secondary | ICD-10-CM | POA: Insufficient documentation

## 2021-11-22 LAB — VITAMIN B12: Vitamin B-12: 565 pg/mL (ref 232–1245)

## 2021-11-22 LAB — COMPREHENSIVE METABOLIC PANEL
ALT: 25 IU/L (ref 0–32)
AST: 24 IU/L (ref 0–40)
Albumin/Globulin Ratio: 1.6 (ref 1.2–2.2)
Albumin: 4.7 g/dL (ref 3.9–4.9)
Alkaline Phosphatase: 63 IU/L (ref 44–121)
BUN/Creatinine Ratio: 22 (ref 12–28)
BUN: 16 mg/dL (ref 8–27)
Bilirubin Total: 0.4 mg/dL (ref 0.0–1.2)
CO2: 24 mmol/L (ref 20–29)
Calcium: 10.1 mg/dL (ref 8.7–10.3)
Chloride: 101 mmol/L (ref 96–106)
Creatinine, Ser: 0.72 mg/dL (ref 0.57–1.00)
Globulin, Total: 3 g/dL (ref 1.5–4.5)
Glucose: 97 mg/dL (ref 70–99)
Potassium: 3.8 mmol/L (ref 3.5–5.2)
Sodium: 141 mmol/L (ref 134–144)
Total Protein: 7.7 g/dL (ref 6.0–8.5)
eGFR: 95 mL/min/{1.73_m2} (ref >59)

## 2021-11-22 LAB — HEMOGLOBIN A1C
Est. average glucose Bld gHb Est-mCnc: 111 mg/dL
Hgb A1c MFr Bld: 5.5 % (ref 4.8–5.6)

## 2021-11-22 LAB — VITAMIN D 25 HYDROXY (VIT D DEFICIENCY, FRACTURES): Vit D, 25-Hydroxy: 46.2 ng/mL (ref 30.0–100.0)

## 2021-11-22 LAB — MAGNESIUM: Magnesium: 2.1 mg/dL (ref 1.6–2.3)

## 2021-11-24 ENCOUNTER — Other Ambulatory Visit (INDEPENDENT_AMBULATORY_CARE_PROVIDER_SITE_OTHER): Payer: Self-pay | Admitting: Family Medicine

## 2021-11-24 DIAGNOSIS — F430 Acute stress reaction: Secondary | ICD-10-CM

## 2021-12-06 NOTE — Progress Notes (Signed)
Chief Complaint:   OBESITY Jill Mcintosh is here to discuss her progress with her obesity treatment plan along with follow-up of her obesity related diagnoses. Jill Mcintosh is on the Category 2 Plan with breakfast options and states she is following her eating plan approximately 40% of the time. Jill Mcintosh states she is walking 30-60 minutes 5 times per week.  Today's visit was #: 12 Starting weight: 184 lbs Starting date: 03/07/2021 Today's weight: 158 lbs Today's date: 11/22/2021 Total lbs lost to date: 26 lbs Total lbs lost since last in-office visit: +4 lbs  Interim History: Patient had episode of back pain and took prednisone, po and IM injection over the last couple of weeks.  Increased hunger.  Patient is unable to find her Saxenda or Reginal Lutes and feels that is the key to her success.   Subjective:   1. Craving for particular food She tried Trokendi 25 mg after last office visit.  After 5 days she had back pain and hd to get steroid shot, oral prednisone.  BMP and renal function, WNL and UA normal.    2. Prediabetes Cannot find GLP-1 (Saxenda) anywhere or Wegovy.    3. Stress reaction Symptoms well controlled currently.  Mood stable, trying to "reinvent herself".  4. Vitamin D deficiency She is currently taking prescription vitamin D 50,000 IU each week. She denies nausea, vomiting or muscle weakness.  Assessment/Plan:   Orders Placed This Encounter  Procedures   Vitamin B12   VITAMIN D 25 Hydroxy (Vit-D Deficiency, Fractures)   Magnesium   Hemoglobin A1c   Comprehensive metabolic panel    Medications Discontinued During This Encounter  Medication Reason   Topiramate ER (TROKENDI XR) 25 MG CP24    buPROPion (WELLBUTRIN SR) 150 MG 12 hr tablet Reorder   metFORMIN (GLUCOPHAGE) 500 MG tablet Reorder   Vitamin D, Ergocalciferol, (DRISDOL) 1.25 MG (50000 UNIT) CAPS capsule Reorder     Meds ordered this encounter  Medications   DISCONTD: Vitamin D, Ergocalciferol, (DRISDOL)  1.25 MG (50000 UNIT) CAPS capsule    Sig: 1 po q 10 d    Dispense:  3 capsule    Refill:  0    30 d supply;  ** OV for RF **   Do not send RF request   DISCONTD: metFORMIN (GLUCOPHAGE) 500 MG tablet    Sig: Take 1 tablet (500 mg total) by mouth 2 (two) times daily with a meal. Lunch and dinner    Dispense:  60 tablet    Refill:  0    30 d supply;  ** OV for RF **   Do not send RF request   DISCONTD: buPROPion (WELLBUTRIN SR) 150 MG 12 hr tablet    Sig: 1 po daily at breakfast    Dispense:  30 tablet    Refill:  0     1. Craving for particular food Discontinue Topamax, unlikely have caused symptoms.  Continue PNP.  Need to decrease simple carbs and increase protein.   2. Prediabetes Refill - metFORMIN (GLUCOPHAGE) 500 MG tablet; Take 1 tablet (500 mg total) by mouth 2 (two) times daily with a meal. Lunch and dinner  Dispense: 60 tablet; Refill: 0  Check labs today, if CMP is WNL's consider increase in metformin at dinner.    - Magnesium - Hemoglobin A1c - Comprehensive metabolic panel  3. Stress reaction Continue with exercise and stress management strategies.    Refill - buPROPion (WELLBUTRIN SR) 150 MG 12 hr tablet; 1 po  daily at breakfast  Dispense: 30 tablet; Refill: 0  Check labs today.   - Vitamin B12  4. Vitamin D deficiency Low Vitamin D level contributes to fatigue and are associated with obesity, breast, and colon cancer. She agrees to continue to take prescription Vitamin D @50 ,000 IU every week and will follow-up for routine testing of Vitamin D, at least 2-3 times per year to avoid over-replacement.  Refill - Vitamin D, Ergocalciferol, (DRISDOL) 1.25 MG (50000 UNIT) CAPS capsule; 1 po q 10 d  Dispense: 3 capsule; Refill: 0  Check labs today.   - Vitamin B12 - VITAMIN D 25 Hydroxy (Vit-D Deficiency, Fractures)  5. Obesity, current BMI 29.9 Increase protein intake and eat on plan at least 50-60% of the time.   Jill Mcintosh is currently in the action stage of  change. As such, her goal is to continue with weight loss efforts. She has agreed to the Category 2 Plan with breakfast options.  Exercise goals:  As is.   Behavioral modification strategies: increasing lean protein intake and decreasing simple carbohydrates.  Jill Mcintosh has agreed to follow-up with our clinic in 2 weeks. She was informed of the importance of frequent follow-up visits to maximize her success with intensive lifestyle modifications for her multiple health conditions.   Jill Mcintosh was informed we would discuss her lab results at her next visit unless there is a critical issue that needs to be addressed sooner. Jill Mcintosh agreed to keep her next visit at the agreed upon time to discuss these results.  Objective:   Blood pressure 132/74, pulse 61, temperature 98 F (36.7 C), height 5\' 1"  (1.549 m), weight 158 lb (71.7 kg), SpO2 100 %. Body mass index is 29.85 kg/m.  General: Cooperative, alert, well developed, in no acute distress. HEENT: Conjunctivae and lids unremarkable. Cardiovascular: Regular rhythm.  Lungs: Normal work of breathing. Neurologic: No focal deficits.   Lab Results  Component Value Date   CREATININE 0.72 11/21/2021   BUN 16 11/21/2021   NA 141 11/21/2021   K 3.8 11/21/2021   CL 101 11/21/2021   CO2 24 11/21/2021   Lab Results  Component Value Date   ALT 25 11/21/2021   AST 24 11/21/2021   ALKPHOS 63 11/21/2021   BILITOT 0.4 11/21/2021   Lab Results  Component Value Date   HGBA1C 5.5 11/21/2021   HGBA1C 5.3 07/02/2021   HGBA1C 5.7 (H) 03/07/2021   HGBA1C 6.1 01/10/2021   Lab Results  Component Value Date   INSULIN 44.8 (H) 03/07/2021   Lab Results  Component Value Date   TSH 3.140 03/07/2021   Lab Results  Component Value Date   CHOL 183 03/07/2021   HDL 38 (L) 03/07/2021   LDLCALC 125 (H) 03/07/2021   TRIG 110 03/07/2021   Lab Results  Component Value Date   VD25OH 46.2 11/21/2021   VD25OH 85.6 07/02/2021   VD25OH 31.2 03/07/2021    Lab Results  Component Value Date   WBC 9.3 01/09/2020   HGB 13.7 01/09/2020   HCT 41.1 01/09/2020   MCV 86.7 01/09/2020   PLT 254 01/09/2020   No results found for: "IRON", "TIBC", "FERRITIN"  Attestation Statements:   Reviewed by clinician on day of visit: allergies, medications, problem list, medical history, surgical history, family history, social history, and previous encounter notes.  I, 03/08/2020, RMA, am acting as 03/08/2020 for Malcolm Metro, DO.   I have reviewed the above documentation for accuracy and completeness, and I agree with the above. Energy manager  Dalbert Batman, D.O.  The 21st Century Cures Act was signed into law in 2016 which includes the topic of electronic health records.  This provides immediate access to information in MyChart.  This includes consultation notes, operative notes, office notes, lab results and pathology reports.  If you have any questions about what you read please let us know at your next visit so we can discuss your concerns and take corrective action if need be.  We are right here with you.

## 2021-12-10 ENCOUNTER — Encounter (INDEPENDENT_AMBULATORY_CARE_PROVIDER_SITE_OTHER): Payer: Self-pay | Admitting: Family Medicine

## 2021-12-10 ENCOUNTER — Ambulatory Visit (INDEPENDENT_AMBULATORY_CARE_PROVIDER_SITE_OTHER): Payer: BC Managed Care – PPO | Admitting: Family Medicine

## 2021-12-10 VITALS — BP 117/75 | HR 67 | Temp 98.1°F | Ht 61.0 in | Wt 161.0 lb

## 2021-12-10 DIAGNOSIS — F32A Depression, unspecified: Secondary | ICD-10-CM | POA: Insufficient documentation

## 2021-12-10 DIAGNOSIS — R7303 Prediabetes: Secondary | ICD-10-CM

## 2021-12-10 DIAGNOSIS — E559 Vitamin D deficiency, unspecified: Secondary | ICD-10-CM

## 2021-12-10 DIAGNOSIS — E669 Obesity, unspecified: Secondary | ICD-10-CM

## 2021-12-10 DIAGNOSIS — F3289 Other specified depressive episodes: Secondary | ICD-10-CM | POA: Diagnosis not present

## 2021-12-10 DIAGNOSIS — Z6830 Body mass index (BMI) 30.0-30.9, adult: Secondary | ICD-10-CM

## 2021-12-10 MED ORDER — VITAMIN D (ERGOCALCIFEROL) 1.25 MG (50000 UNIT) PO CAPS: ORAL_CAPSULE | ORAL | 0 refills | Status: DC

## 2021-12-10 MED ORDER — METFORMIN HCL 500 MG PO TABS: 500.0000 mg | ORAL_TABLET | Freq: Two times a day (BID) | ORAL | 0 refills | Status: DC

## 2021-12-10 MED ORDER — BUPROPION HCL ER (SR) 150 MG PO TB12: ORAL_TABLET | ORAL | 0 refills | Status: DC

## 2021-12-19 NOTE — Progress Notes (Unsigned)
Chief Complaint:   OBESITY Jill Mcintosh is here to discuss her progress with her obesity treatment plan along with follow-up of her obesity related diagnoses. [name] is on the Category 2 Plan and states she is following her eating plan approximately 40% of the time. [name] states she is walking 40 minutes 4 times per week.  Today's visit was #: 13 Starting weight: 184 LBS Starting date: 03/07/2021 Today's weight: 158 LBS Today's date: 12/10/2021 Total lbs lost to date: 26 LBS Total lbs lost since last in-office visit: +3 LBS  Interim History: Patient admits to more stress eating lately.  She is consistently walking.  She denies meal skipping.  She keeps junk food out of the house.  She reports good support at home. She denies large portions.  She stopped Trokendi due to flank pain.  Subjective:   1. Prediabetes Discussed labs with patient today. A1c has improved to 5.5, on 11/21/2021.  She has lost 12.5% TBW in 9 months.  She is taking metformin 500 mg twice daily.  She denies GI side effects.  2. Vitamin D deficiency Discussed labs with patient today. She is on prescription vitamin D 50,000 IUs every 10 days.  Her last vitamin D level was 46.2, on 11/21/2021.  3. Other depression with emotional eating Patient stress levels are higher with holidays.  She has a good support system at home.  She is taking Wellbutrin SR 150 mg daily, it is helping with her cravings.  Assessment/Plan:   1. Prediabetes Refill- metFORMIN (GLUCOPHAGE) 500 MG tablet; Take 1 tablet (500 mg total) by mouth 2 (two) times daily with a meal. Lunch and dinner  Dispense: 60 tablet; Refill: 0  2. Vitamin D deficiency Refill- Vitamin D, Ergocalciferol, (DRISDOL) 1.25 MG (50000 UNIT) CAPS capsule; 1 po q 10 d  Dispense: 3 capsule; Refill: 0  3. Other depression with emotional eating Refill- buPROPion (WELLBUTRIN SR) 150 MG 12 hr tablet; 1 po daily at breakfast  Dispense: 30 tablet; Refill: 0  4.  Obesity,current BMI 30.6 1.  She gained 1 pound of muscle mass in 1 month. 2.  Continue to practice mindful eating, eating on a schedule given higher stress levels.  [name] is currently in the action stage of change. As such, her goal is to continue with weight loss efforts. She has agreed to the Category 2 Plan.   Exercise goals:  Add weight training 2 times per week.   Behavioral modification strategies: increasing lean protein intake, increasing water intake, increasing high fiber foods, no skipping meals, meal planning and cooking strategies, keeping healthy foods in the home, ways to avoid boredom eating, emotional eating strategies, holiday eating strategies , and planning for success.  Jill Mcintosh has agreed to follow-up with our clinic in 3 weeks. She was informed of the importance of frequent follow-up visits to maximize her success with intensive lifestyle modifications for her multiple health conditions.   Objective:   Blood pressure 117/75, pulse 67, temperature 98.1 F (36.7 C), height 5\' 1"  (1.549 m), weight 161 lb (73 kg), SpO2 98 %. Body mass index is 30.42 kg/m.  General: Cooperative, alert, well developed, in no acute distress. HEENT: Conjunctivae and lids unremarkable. Cardiovascular: Regular rhythm.  Lungs: Normal work of breathing. Neurologic: No focal deficits.   Lab Results  Component Value Date   CREATININE 0.72 11/21/2021   BUN 16 11/21/2021   NA 141 11/21/2021   K 3.8 11/21/2021   CL 101 11/21/2021   CO2 24 11/21/2021  Lab Results  Component Value Date   ALT 25 11/21/2021   AST 24 11/21/2021   ALKPHOS 63 11/21/2021   BILITOT 0.4 11/21/2021   Lab Results  Component Value Date   HGBA1C 5.5 11/21/2021   HGBA1C 5.3 07/02/2021   HGBA1C 5.7 (H) 03/07/2021   HGBA1C 6.1 01/10/2021   Lab Results  Component Value Date   INSULIN 44.8 (H) 03/07/2021   Lab Results  Component Value Date   TSH 3.140 03/07/2021   Lab Results  Component Value Date    CHOL 183 03/07/2021   HDL 38 (L) 03/07/2021   LDLCALC 125 (H) 03/07/2021   TRIG 110 03/07/2021   Lab Results  Component Value Date   VD25OH 46.2 11/21/2021   VD25OH 85.6 07/02/2021   VD25OH 31.2 03/07/2021   Lab Results  Component Value Date   WBC 9.3 01/09/2020   HGB 13.7 01/09/2020   HCT 41.1 01/09/2020   MCV 86.7 01/09/2020   PLT 254 01/09/2020   No results found for: "IRON", "TIBC", "FERRITIN"  Attestation Statements:   Reviewed by clinician on day of visit: allergies, medications, problem list, medical history, surgical history, family history, social history, and previous encounter notes.  I, Malcolm Metro, am acting as Energy manager for Seymour Bars, DO.  I have reviewed the above documentation for accuracy and completeness, and I agree with the above. Glennis Brink, DO

## 2021-12-26 ENCOUNTER — Encounter (INDEPENDENT_AMBULATORY_CARE_PROVIDER_SITE_OTHER): Payer: Self-pay | Admitting: Family Medicine

## 2021-12-26 ENCOUNTER — Ambulatory Visit (INDEPENDENT_AMBULATORY_CARE_PROVIDER_SITE_OTHER): Payer: BC Managed Care – PPO | Admitting: Family Medicine

## 2021-12-26 ENCOUNTER — Telehealth (INDEPENDENT_AMBULATORY_CARE_PROVIDER_SITE_OTHER): Payer: Self-pay

## 2021-12-26 VITALS — BP 151/78 | HR 64 | Temp 98.3°F | Ht 61.0 in | Wt 163.8 lb

## 2021-12-26 DIAGNOSIS — R7303 Prediabetes: Secondary | ICD-10-CM

## 2021-12-26 DIAGNOSIS — E559 Vitamin D deficiency, unspecified: Secondary | ICD-10-CM | POA: Diagnosis not present

## 2021-12-26 DIAGNOSIS — Z6830 Body mass index (BMI) 30.0-30.9, adult: Secondary | ICD-10-CM

## 2021-12-26 DIAGNOSIS — F3289 Other specified depressive episodes: Secondary | ICD-10-CM | POA: Diagnosis not present

## 2021-12-26 DIAGNOSIS — E669 Obesity, unspecified: Secondary | ICD-10-CM

## 2021-12-26 MED ORDER — METFORMIN HCL 500 MG PO TABS: 500.0000 mg | ORAL_TABLET | Freq: Two times a day (BID) | ORAL | 0 refills | Status: DC

## 2021-12-26 MED ORDER — VITAMIN D (ERGOCALCIFEROL) 1.25 MG (50000 UNIT) PO CAPS: ORAL_CAPSULE | ORAL | 0 refills | Status: DC

## 2021-12-26 MED ORDER — BUPROPION HCL ER (SR) 200 MG PO TB12: ORAL_TABLET | ORAL | 0 refills | Status: DC

## 2021-12-26 MED ORDER — ZEPBOUND 2.5 MG/0.5ML ~~LOC~~ SOAJ: 2.5000 mg | SUBCUTANEOUS | 0 refills | Status: DC

## 2021-12-26 NOTE — Telephone Encounter (Signed)
Prior auth for zepbound sent thru [employer]: Key: [id number] - Rx #: [id number] Zepbound 2.5MG /0.5ML pen-injectors

## 2022-01-01 NOTE — Telephone Encounter (Signed)
Zepbound denied

## 2022-01-19 NOTE — Progress Notes (Signed)
Chief Complaint:   OBESITY Jill Mcintosh is here to discuss her progress with her obesity treatment plan along with follow-up of her obesity related diagnoses. Jill Mcintosh is on the Category 2 Plan and states she is following her eating plan approximately 40% of the time. Jill Mcintosh states she is walking 20 minutes 3 times per week.  Today's visit was #: 63 Starting weight: 184 LBS Starting date: 03/07/2021 Today's weight: 163 LBS Today's date: 12/26/2021 Total lbs lost to date: 21 LBS Total lbs lost since last in-office visit: +2 LBS  Interim History: Patient's lupus has been flaring up and she is on prednisone and not moving much.  "I had a quick Thanksgiving, and then got rid of the foods to the mother-in-law and the neighbor."  No issues with meal plan.  Subjective:   1. Prediabetes Patient is on a following meal plan < 40% of the time.  Some cravings with holidays.  2. Vitamin D deficiency She is currently taking prescription vitamin D 50,000 IU each week. She denies nausea, vomiting or muscle weakness.  3. Other depression with emotional eating Patient would like something to help her with holidays stressors.  Requesting higher dose not twice daily dosing.  No side effects, works well.  Assessment/Plan:  No orders of the defined types were placed in this encounter.   Medications Discontinued During This Encounter  Medication Reason   Vitamin D, Ergocalciferol, (DRISDOL) 1.25 MG (50000 UNIT) CAPS capsule Reorder   metFORMIN (GLUCOPHAGE) 500 MG tablet Reorder   buPROPion (WELLBUTRIN SR) 150 MG 12 hr tablet Reorder     Meds ordered this encounter  Medications   DISCONTD: buPROPion (WELLBUTRIN SR) 200 MG 12 hr tablet    Sig: 1 po daily at breakfast    Dispense:  30 tablet    Refill:  0   DISCONTD: metFORMIN (GLUCOPHAGE) 500 MG tablet    Sig: Take 1 tablet (500 mg total) by mouth 2 (two) times daily with a meal. Lunch and dinner    Dispense:  60 tablet    Refill:  0    30 d  supply;  ** OV for RF **   Do not send RF request   DISCONTD: Vitamin D, Ergocalciferol, (DRISDOL) 1.25 MG (50000 UNIT) CAPS capsule    Sig: 1 po q 10 d    Dispense:  3 capsule    Refill:  0    30 d supply;  ** OV for RF **   Do not send RF request   DISCONTD: tirzepatide (ZEPBOUND) 2.5 MG/0.5ML Pen    Sig: Inject 2.5 mg into the skin once a week.    Dispense:  2 mL    Refill:  0     1. Prediabetes Jill Mcintosh will continue to work on weight loss, exercise, and decreasing simple carbohydrates to help decrease the risk of diabetes.   Refill- metFORMIN (GLUCOPHAGE) 500 MG tablet; Take 1 tablet (500 mg total) by mouth 2 (two) times daily with a meal. Lunch and dinner  Dispense: 60 tablet; Refill: 0  2. Vitamin D deficiency  - Vitamin D, Ergocalciferol, (DRISDOL) 1.25 MG (50000 UNIT) CAPS capsule; 1 po q 10 d  Dispense: 3 capsule; Refill: 0  3. Other depression with emotional eating Patient is unable to take Wellbutrin twice daily.  Increased dose to 200 mg XR once daily.  Increase- buPROPion (WELLBUTRIN SR) 200 MG 12 hr tablet; 1 po daily at breakfast  Dispense: 30 tablet; Refill: 0  4. Obesity,current BMI  30.9 Patient is unable to tolerate Trok[place] and cannot 5 Wegovy or Saxenda anywhere although it has been  approved.  Patient request prescription for Zepbound in place of Wegovy.  Start- tirzepatide (ZEPBOUND) 2.5 MG/0.5ML Pen; Inject 2.5 mg into the skin once a week.  Dispense: 2 mL; Refill: 0  [name] is currently in the action stage of change. As such, her goal is to continue with weight loss efforts. She has agreed to the Category 2 Plan.   Exercise goals:  As is.  Behavioral modification strategies: holiday eating strategies , avoiding temptations, and planning for success.  Jill Mcintosh has agreed to follow-up with our clinic in 4 weeks. She was informed of the importance of frequent follow-up visits to maximize her success with intensive lifestyle modifications for her multiple  health conditions.   Objective:   Blood pressure (!) 151/78, pulse 64, temperature 98.3 F (36.8 C), height 5\' 1"  (1.549 m), weight 163 lb 12.8 oz (74.3 kg), SpO2 98 %. Body mass index is 30.95 kg/m.  General: Cooperative, alert, well developed, in no acute distress. HEENT: Conjunctivae and lids unremarkable. Cardiovascular: Regular rhythm.  Lungs: Normal work of breathing. Neurologic: No focal deficits.   Lab Results  Component Value Date   CREATININE 0.72 11/21/2021   BUN 16 11/21/2021   NA 141 11/21/2021   K 3.8 11/21/2021   CL 101 11/21/2021   CO2 24 11/21/2021   Lab Results  Component Value Date   ALT 25 11/21/2021   AST 24 11/21/2021   ALKPHOS 63 11/21/2021   BILITOT 0.4 11/21/2021   Lab Results  Component Value Date   HGBA1C 5.5 11/21/2021   HGBA1C 5.3 07/02/2021   HGBA1C 5.7 (H) 03/07/2021   HGBA1C 6.1 01/10/2021   Lab Results  Component Value Date   INSULIN 44.8 (H) 03/07/2021   Lab Results  Component Value Date   TSH 3.140 03/07/2021   Lab Results  Component Value Date   CHOL 183 03/07/2021   HDL 38 (L) 03/07/2021   LDLCALC 125 (H) 03/07/2021   TRIG 110 03/07/2021   Lab Results  Component Value Date   VD25OH 46.2 11/21/2021   VD25OH 85.6 07/02/2021   VD25OH 31.2 03/07/2021   Lab Results  Component Value Date   WBC 9.3 01/09/2020   HGB 13.7 01/09/2020   HCT 41.1 01/09/2020   MCV 86.7 01/09/2020   PLT 254 01/09/2020   No results found for: "IRON", "TIBC", "FERRITIN"  Attestation Statements:   Reviewed by clinician on day of visit: allergies, medications, problem list, medical history, surgical history, family history, social history, and previous encounter notes.  I, Davy Pique, RMA, am acting as Location manager for [employer], DO.   I have reviewed the above documentation for accuracy and completeness, and I agree with the above. Jill Mcintosh, D.O.  The [place] was signed into law in 2016 which includes  the topic of electronic health records.  This provides immediate access to information in MyChart.  This includes consultation notes, operative notes, office notes, lab results and pathology reports.  If you have any questions about what you read please let us know at your next visit so we can discuss your concerns and take corrective action if need be.  We are right here with you.

## 2022-01-23 ENCOUNTER — Encounter (INDEPENDENT_AMBULATORY_CARE_PROVIDER_SITE_OTHER): Payer: Self-pay | Admitting: Family Medicine

## 2022-01-23 ENCOUNTER — Ambulatory Visit (INDEPENDENT_AMBULATORY_CARE_PROVIDER_SITE_OTHER): Payer: BC Managed Care – PPO | Admitting: Family Medicine

## 2022-01-23 VITALS — BP 128/80 | HR 73 | Temp 98.3°F | Ht 61.0 in | Wt 162.4 lb

## 2022-01-23 DIAGNOSIS — E669 Obesity, unspecified: Secondary | ICD-10-CM

## 2022-01-23 DIAGNOSIS — F3289 Other specified depressive episodes: Secondary | ICD-10-CM | POA: Diagnosis not present

## 2022-01-23 DIAGNOSIS — E559 Vitamin D deficiency, unspecified: Secondary | ICD-10-CM

## 2022-01-23 DIAGNOSIS — R7303 Prediabetes: Secondary | ICD-10-CM

## 2022-01-23 DIAGNOSIS — Z6830 Body mass index (BMI) 30.0-30.9, adult: Secondary | ICD-10-CM

## 2022-01-23 MED ORDER — BUPROPION HCL ER (SR) 200 MG PO TB12: ORAL_TABLET | ORAL | 0 refills | Status: DC

## 2022-01-23 MED ORDER — VITAMIN D (ERGOCALCIFEROL) 1.25 MG (50000 UNIT) PO CAPS: ORAL_CAPSULE | ORAL | 0 refills | Status: DC

## 2022-01-23 MED ORDER — METFORMIN HCL 500 MG PO TABS: 500.0000 mg | ORAL_TABLET | Freq: Two times a day (BID) | ORAL | 0 refills | Status: DC

## 2022-01-23 MED ORDER — SEMAGLUTIDE-WEIGHT MANAGEMENT 0.5 MG/0.5ML ~~LOC~~ SOAJ: 0.5000 mg | SUBCUTANEOUS | 0 refills | Status: AC

## 2022-02-11 NOTE — Progress Notes (Signed)
Chief Complaint:   OBESITY Jill Mcintosh is here to discuss her progress with her obesity treatment plan along with follow-up of her obesity related diagnoses. Jill Mcintosh is on the Category 2 Plan and states she is following her eating plan approximately 45% of the time. Jill Mcintosh states she is walking and lifting weights for 60 minutes 4-5 times per week.  Today's visit was #: 15 Starting weight: 184 lbs Starting date: 03/07/2021 Today's weight: 162 lbs Today's date: 01/23/2022 Total lbs lost to date: 22 Total lbs lost since last in-office visit: 1  Interim History: Jill Mcintosh's life has been a bit crazy for her lately with family stressors. She is not able to follow the meal plan like she knows she needs to in order to see results. She has no concerns with the meal plan.   Subjective:   1. Prediabetes Last A1c was 5.5 on 11/21/2021. No side effects were noted.   2. Vitamin D deficiency Last Vitamin D level was 46.2 on 11/21/2021, and at goal.   3. Other depression with emotional eating We increased Wellbutrin at her last office visit. Helping with emotional eating. Tolerating well with no side effects. Started back in on 08/27/2021. Emotions stable.   Assessment/Plan:  No orders of the defined types were placed in this encounter.   Medications Discontinued During This Encounter  Medication Reason   tirzepatide (ZEPBOUND) 2.5 MG/0.5ML Pen Change in therapy   buPROPion (WELLBUTRIN SR) 200 MG 12 hr tablet Reorder   metFORMIN (GLUCOPHAGE) 500 MG tablet Reorder   Vitamin D, Ergocalciferol, (DRISDOL) 1.25 MG (50000 UNIT) CAPS capsule Reorder     Meds ordered this encounter  Medications   buPROPion (WELLBUTRIN SR) 200 MG 12 hr tablet    Sig: 1 po daily at breakfast    Dispense:  30 tablet    Refill:  0   Vitamin D, Ergocalciferol, (DRISDOL) 1.25 MG (50000 UNIT) CAPS capsule    Sig: 1 po q 10 d    Dispense:  3 capsule    Refill:  0    30 d supply;  ** OV for RF **   Do not send RF  request   metFORMIN (GLUCOPHAGE) 500 MG tablet    Sig: Take 1 tablet (500 mg total) by mouth 2 (two) times daily with a meal. Lunch and dinner    Dispense:  60 tablet    Refill:  0    30 d supply;  ** OV for RF **   Do not send RF request   Semaglutide-Weight Management 0.5 MG/0.5ML SOAJ    Sig: Inject 0.5 mg into the skin once a week.    Dispense:  2 mL    Refill:  0     1. Prediabetes We will refill metformin for 1 month. Decrease simple carbohydrates, increase protein, and follow prudent nutritional plan.   - metFORMIN (GLUCOPHAGE) 500 MG tablet; Take 1 tablet (500 mg total) by mouth 2 (two) times daily with a meal. Lunch and dinner  Dispense: 60 tablet; Refill: 0  2. Vitamin D deficiency We will refill Ergo for 1 month.   - Vitamin D, Ergocalciferol, (DRISDOL) 1.25 MG (50000 UNIT) CAPS capsule; 1 po q 10 d  Dispense: 3 capsule; Refill: 0  3. Other depression with emotional eating We will refill Wellbutrin SR at same dose. Continue exercise and prudent nutritional plan.   - buPROPion (WELLBUTRIN SR) 200 MG 12 hr tablet; 1 po daily at breakfast  Dispense: 30 tablet; Refill: 0  4. Obesity, current BMI 30.7 Patient started [facility] right after her last office visit. Finally found it. No side effects. Patient feels it is not strong enough and she desires an increased dose. Reill Wegovy and increase to 0.5 mg weekly. (Patient had been on 0.25 mg weekly for the past 3-4 weeks now).   - Semaglutide-Weight Management 0.5 MG/0.5ML SOAJ; Inject 0.5 mg into the skin once a week.  Dispense: 2 mL; Refill: 0  Jill Mcintosh is currently in the action stage of change. As such, her goal is to continue with weight loss efforts. She has agreed to the Category 2 Plan.   Exercise goals: As is.   Behavioral modification strategies: meal planning and cooking strategies and planning for success.  Jill Mcintosh has agreed to follow-up with our clinic in 3 to 4 weeks. She was informed of the importance of frequent  follow-up visits to maximize her success with intensive lifestyle modifications for her multiple health conditions.   Objective:   Blood pressure 128/80, pulse 73, temperature 98.3 F (36.8 C), height 5' 1"$  (1.549 m), weight 162 lb 6.4 oz (73.7 kg), SpO2 99 %. Body mass index is 30.69 kg/m.  General: Cooperative, alert, well developed, in no acute distress. HEENT: Conjunctivae and lids unremarkable. Cardiovascular: Regular rhythm.  Lungs: Normal work of breathing. Neurologic: No focal deficits.   Lab Results  Component Value Date   CREATININE 0.72 11/21/2021   BUN 16 11/21/2021   NA 141 11/21/2021   K 3.8 11/21/2021   CL 101 11/21/2021   CO2 24 11/21/2021   Lab Results  Component Value Date   ALT 25 11/21/2021   AST 24 11/21/2021   ALKPHOS 63 11/21/2021   BILITOT 0.4 11/21/2021   Lab Results  Component Value Date   HGBA1C 5.5 11/21/2021   HGBA1C 5.3 07/02/2021   HGBA1C 5.7 (H) 03/07/2021   HGBA1C 6.1 01/10/2021   Lab Results  Component Value Date   INSULIN 44.8 (H) 03/07/2021   Lab Results  Component Value Date   TSH 3.140 03/07/2021   Lab Results  Component Value Date   CHOL 183 03/07/2021   HDL 38 (L) 03/07/2021   LDLCALC 125 (H) 03/07/2021   TRIG 110 03/07/2021   Lab Results  Component Value Date   VD25OH 46.2 11/21/2021   VD25OH 85.6 07/02/2021   VD25OH 31.2 03/07/2021   Lab Results  Component Value Date   WBC 9.3 01/09/2020   HGB 13.7 01/09/2020   HCT 41.1 01/09/2020   MCV 86.7 01/09/2020   PLT 254 01/09/2020   No results found for: "IRON", "TIBC", "FERRITIN"  Attestation Statements:   Reviewed by clinician on day of visit: allergies, medications, problem list, medical history, surgical history, family history, social history, and previous encounter notes.   Wilhemena Durie, am acting as transcriptionist for [employer], DO.   I have reviewed the above documentation for accuracy and completeness, and I agree with the above.  Marjory Sneddon, D.O.  The [place] was signed into law in 2016 which includes the topic of electronic health records.  This provides immediate access to information in MyChart.  This includes consultation notes, operative notes, office notes, lab results and pathology reports.  If you have any questions about what you read please let us know at your next visit so we can discuss your concerns and take corrective action if need be.  We are right here with you.

## 2022-02-21 ENCOUNTER — Ambulatory Visit (INDEPENDENT_AMBULATORY_CARE_PROVIDER_SITE_OTHER): Payer: BC Managed Care – PPO | Admitting: Family Medicine

## 2022-02-27 ENCOUNTER — Encounter (INDEPENDENT_AMBULATORY_CARE_PROVIDER_SITE_OTHER): Payer: Self-pay | Admitting: Family Medicine

## 2022-02-27 ENCOUNTER — Ambulatory Visit (INDEPENDENT_AMBULATORY_CARE_PROVIDER_SITE_OTHER): Payer: BC Managed Care – PPO | Admitting: Family Medicine

## 2022-02-27 VITALS — BP 124/80 | HR 81 | Temp 98.1°F | Ht 61.0 in | Wt 159.0 lb

## 2022-02-27 DIAGNOSIS — E669 Obesity, unspecified: Secondary | ICD-10-CM | POA: Diagnosis not present

## 2022-02-27 DIAGNOSIS — R7303 Prediabetes: Secondary | ICD-10-CM | POA: Diagnosis not present

## 2022-02-27 DIAGNOSIS — F3289 Other specified depressive episodes: Secondary | ICD-10-CM

## 2022-02-27 DIAGNOSIS — Z6830 Body mass index (BMI) 30.0-30.9, adult: Secondary | ICD-10-CM

## 2022-02-27 DIAGNOSIS — E559 Vitamin D deficiency, unspecified: Secondary | ICD-10-CM | POA: Diagnosis not present

## 2022-02-27 MED ORDER — METFORMIN HCL 500 MG PO TABS: 500.0000 mg | ORAL_TABLET | Freq: Two times a day (BID) | ORAL | 0 refills | Status: DC

## 2022-02-27 MED ORDER — BUPROPION HCL ER (SR) 200 MG PO TB12: ORAL_TABLET | ORAL | 0 refills | Status: DC

## 2022-02-27 MED ORDER — VITAMIN D (ERGOCALCIFEROL) 1.25 MG (50000 UNIT) PO CAPS: ORAL_CAPSULE | ORAL | 0 refills | Status: DC

## 2022-02-27 MED ORDER — WEGOVY 0.5 MG/0.5ML ~~LOC~~ SOAJ: 0.5000 mg | SUBCUTANEOUS | 0 refills | Status: DC

## 2022-03-13 NOTE — Progress Notes (Signed)
Chief Complaint:   OBESITY Jill Mcintosh is here to discuss her progress with her obesity treatment plan along with follow-up of her obesity related diagnoses. [name] is on the Category 2 Plan and states she is following her eating plan approximately 75% of the time. [name] states she is walking 45 minutes 4 times per week.  Today's visit was #: 54 Starting weight: 184 lbs Starting date: 03/07/2021 Today's weight: 159 lbs Today's date: 02/27/2022 Total lbs lost to date: 25 lbs Total lbs lost since last in-office visit: 3 lbs  Interim History: Patient had terrible back pain and spent 2 weeks laying around on a heating pad.  She got a new puppy, and patient needs to walk him 2 miles per day to keep him calm and helping her to stay active.  She still has a lot of stress with her family, which patient discussed today.    Subjective:   1. Prediabetes We increased Wegovy at last office visit 0.5 mg.  It is working much better with decreasing hunger and cravings.  No issues or side effects with Wegovy or metformin.  Patient desires no change in doses today.  2. Vitamin D deficiency She is currently taking prescription vitamin D 50,000 IU each week. She denies nausea, vomiting or muscle weakness.  Last vitamin D level 46.2 about 3 months ago.  3. Other depression with emotional eating Patient is doing better although still a lot of stressors.  She thinks current dose of Wellbutrin is good. She does not see a counselor and does not feel she needs to.    Assessment/Plan:  No orders of the defined types were placed in this encounter.   Medications Discontinued During This Encounter  Medication Reason   buPROPion (WELLBUTRIN SR) 200 MG 12 hr tablet Reorder   Vitamin D, Ergocalciferol, (DRISDOL) 1.25 MG (50000 UNIT) CAPS capsule Reorder   metFORMIN (GLUCOPHAGE) 500 MG tablet Reorder   Semaglutide-Weight Management (WEGOVY) 0.5 MG/0.5ML SOAJ Reorder     Meds ordered this encounter   Medications   buPROPion (WELLBUTRIN SR) 200 MG 12 hr tablet    Sig: 1 po daily at breakfast    Dispense:  30 tablet    Refill:  0   metFORMIN (GLUCOPHAGE) 500 MG tablet    Sig: Take 1 tablet (500 mg total) by mouth 2 (two) times daily with a meal. Lunch and dinner    Dispense:  60 tablet    Refill:  0    30 d supply;  ** OV for RF **   Do not send RF request   Vitamin D, Ergocalciferol, (DRISDOL) 1.25 MG (50000 UNIT) CAPS capsule    Sig: 1 po q 10 d    Dispense:  3 capsule    Refill:  0    30 d supply;  ** OV for RF **   Do not send RF request   Semaglutide-Weight Management (WEGOVY) 0.5 MG/0.5ML SOAJ    Sig: Inject 0.5 mg into the skin once a week.    Dispense:  2 mL    Refill:  0     1. Prediabetes Continue PNP, weight loss and increase exercises. A1c stable at 5.5 about 2-3 months ago.   Refill - metFORMIN (GLUCOPHAGE) 500 MG tablet; Take 1 tablet (500 mg total) by mouth 2 (two) times daily with a meal. Lunch and dinner  Dispense: 60 tablet; Refill: 0  2. Vitamin D deficiency No change in dosing, will continue to monitor along with weight  loss journey as her risk of over dose increases.  Goal is 50-70.  Refill - Vitamin D, Ergocalciferol, (DRISDOL) 1.25 MG (50000 UNIT) CAPS capsule; 1 po q 10 d  Dispense: 3 capsule; Refill: 0  3. Other depression with emotional eating Increase walking and self care activities.   - Discussed how thoughts affect eating habits, modeling of thoughts, feelings, and behaviors, and strategies for change.  Importance of not skipping meals and getting all her proteins and fiber in on a daily basis discussed.   - Discussed cognitive distortions, coping thoughts, and how to change our thoughts/ self talk regarding foods/ eating patterns. - No SI/ HI.  Mood stable currently - Behavior modification techniques were discussed today to help deal with emotional/ non-hunger eating behaviors including but not limited to exercise for stress management,  meditation/prayer, behavorial sessions with her therapist and self care activities like adequate sleep (7-9 hrs/nite). - Reminded patient of the importance of following their prudent nutrition plan and how food can affect mood as well to support emotional wellbeing.  - We will continue to monitor closely alongside PCP / other specialists.  Refill - buPROPion (WELLBUTRIN SR) 200 MG 12 hr tablet; 1 po daily at breakfast  Dispense: 30 tablet; Refill: 0  4. Obesity, current BMI 30.0(start bmi 34.77) Refill - Semaglutide-Weight Management (WEGOVY) 0.5 MG/0.5ML SOAJ; Inject 0.5 mg into the skin once a week.  Dispense: 2 mL; Refill: 0  [name] is currently in the action stage of change. As such, her goal is to continue with weight loss efforts. She has agreed to the Category 2 Plan with breakfast and lunch options. .   Exercise goals:  As is.   Behavioral modification strategies: increasing water intake and no skipping meals.  [name] has agreed to follow-up with our clinic in 3-4 weeks. She was informed of the importance of frequent follow-up visits to maximize her success with intensive lifestyle modifications for her multiple health conditions.    Objective:   Blood pressure 124/80, pulse 81, temperature 98.1 F (36.7 C), height '5\' 1"'$  (1.549 m), weight 159 lb (72.1 kg), SpO2 100 %. Body mass index is 30.04 kg/m.  General: Cooperative, alert, well developed, in no acute distress. HEENT: Conjunctivae and lids unremarkable. Cardiovascular: Regular rhythm.  Lungs: Normal work of breathing. Neurologic: No focal deficits.   Lab Results  Component Value Date   CREATININE 0.72 11/21/2021   BUN 16 11/21/2021   NA 141 11/21/2021   K 3.8 11/21/2021   CL 101 11/21/2021   CO2 24 11/21/2021   Lab Results  Component Value Date   ALT 25 11/21/2021   AST 24 11/21/2021   ALKPHOS 63 11/21/2021   BILITOT 0.4 11/21/2021   Lab Results  Component Value Date   HGBA1C 5.5 11/21/2021   HGBA1C 5.3  07/02/2021   HGBA1C 5.7 (H) 03/07/2021   HGBA1C 6.1 01/10/2021   Lab Results  Component Value Date   INSULIN 44.8 (H) 03/07/2021   Lab Results  Component Value Date   TSH 3.140 03/07/2021   Lab Results  Component Value Date   CHOL 183 03/07/2021   HDL 38 (L) 03/07/2021   LDLCALC 125 (H) 03/07/2021   TRIG 110 03/07/2021   Lab Results  Component Value Date   VD25OH 46.2 11/21/2021   VD25OH 85.6 07/02/2021   VD25OH 31.2 03/07/2021   Lab Results  Component Value Date   WBC 9.3 01/09/2020   HGB 13.7 01/09/2020   HCT 41.1 01/09/2020   MCV  86.7 01/09/2020   PLT 254 01/09/2020   No results found for: "IRON", "TIBC", "FERRITIN"  Attestation Statements:   Reviewed by clinician on day of visit: allergies, medications, problem list, medical history, surgical history, family history, social history, and previous encounter notes.  I, Davy Pique, RMA, am acting as Location manager for [employer], DO.  I have reviewed the above documentation for completeness, and I agree with the above. Marjory Sneddon, D.O.  The [place] was signed into law in 2016 which includes the topic of electronic health records.  This provides immediate access to information in MyChart.  This includes consultation notes, operative notes, office notes, lab results and pathology reports.  If you have any questions about what you read please let us know at your next visit so we can discuss your concerns and take corrective action if need be.  We are right here with you.

## 2022-03-20 ENCOUNTER — Encounter (INDEPENDENT_AMBULATORY_CARE_PROVIDER_SITE_OTHER): Payer: Self-pay | Admitting: Family Medicine

## 2022-03-20 ENCOUNTER — Ambulatory Visit (INDEPENDENT_AMBULATORY_CARE_PROVIDER_SITE_OTHER): Payer: BC Managed Care – PPO | Admitting: Family Medicine

## 2022-03-20 VITALS — BP 128/82 | HR 71 | Temp 98.2°F | Ht 61.0 in | Wt 160.4 lb

## 2022-03-20 DIAGNOSIS — E559 Vitamin D deficiency, unspecified: Secondary | ICD-10-CM

## 2022-03-20 DIAGNOSIS — I10 Essential (primary) hypertension: Secondary | ICD-10-CM | POA: Insufficient documentation

## 2022-03-20 DIAGNOSIS — R7303 Prediabetes: Secondary | ICD-10-CM | POA: Diagnosis not present

## 2022-03-20 DIAGNOSIS — E669 Obesity, unspecified: Secondary | ICD-10-CM

## 2022-03-20 DIAGNOSIS — F3289 Other specified depressive episodes: Secondary | ICD-10-CM

## 2022-03-20 DIAGNOSIS — Z6830 Body mass index (BMI) 30.0-30.9, adult: Secondary | ICD-10-CM

## 2022-03-20 MED ORDER — METFORMIN HCL 500 MG PO TABS: 500.0000 mg | ORAL_TABLET | Freq: Two times a day (BID) | ORAL | 0 refills | Status: DC

## 2022-03-20 MED ORDER — WEGOVY 0.5 MG/0.5ML ~~LOC~~ SOAJ: 0.5000 mg | SUBCUTANEOUS | 0 refills | Status: DC

## 2022-03-20 MED ORDER — VITAMIN D (ERGOCALCIFEROL) 1.25 MG (50000 UNIT) PO CAPS: ORAL_CAPSULE | ORAL | 0 refills | Status: DC

## 2022-03-20 MED ORDER — BUPROPION HCL ER (SR) 200 MG PO TB12: ORAL_TABLET | ORAL | 0 refills | Status: DC

## 2022-03-20 NOTE — Telephone Encounter (Signed)
Message from plan: Your PA request has been approved.  Authorization Expiration Date: March 20, 2023.

## 2022-03-20 NOTE — Telephone Encounter (Signed)
Prior auth submitted thru covermymeds. ([place]) Rx #[id number] 0.'5MG'$ /0.5ML auto-injectors

## 2022-03-20 NOTE — Progress Notes (Signed)
Marjory Sneddon, D.O.  ABFM, ABOM Specializing in Clinical Bariatric Medicine  Office located at: [address], [place]  [address]     Assessment and Plan:    Medications Discontinued During This Encounter  Medication Reason   buPROPion (WELLBUTRIN SR) 200 MG 12 hr tablet Reorder   metFORMIN (GLUCOPHAGE) 500 MG tablet Reorder   Vitamin D, Ergocalciferol, (DRISDOL) 1.25 MG (50000 UNIT) CAPS capsule Reorder   Semaglutide-Weight Management (WEGOVY) 0.5 MG/0.5ML SOAJ Reorder     Meds ordered this encounter  Medications   buPROPion (WELLBUTRIN SR) 200 MG 12 hr tablet    Sig: 1 po daily at breakfast    Dispense:  30 tablet    Refill:  0   Vitamin D, Ergocalciferol, (DRISDOL) 1.25 MG (50000 UNIT) CAPS capsule    Sig: 1 po q 10 d    Dispense:  3 capsule    Refill:  0    30 d supply;  ** OV for RF **   Do not send RF request   metFORMIN (GLUCOPHAGE) 500 MG tablet    Sig: Take 1 tablet (500 mg total) by mouth 2 (two) times daily with a meal. Lunch and dinner    Dispense:  60 tablet    Refill:  0    30 d supply;  ** OV for RF **   Do not send RF request   Semaglutide-Weight Management (WEGOVY) 0.5 MG/0.5ML SOAJ    Sig: Inject 0.5 mg into the skin once a week.    Dispense:  2 mL    Refill:  0     Prediabetes Assessment: Condition is At goal.. Labs were reviewed.  Lab Results  Component Value Date   HGBA1C 5.5 11/21/2021   HGBA1C 5.3 07/02/2021   HGBA1C 5.7 (H) 03/07/2021   INSULIN 44.8 (H) 03/07/2021  She reports compliance with [id number] 0.'5mg'$  weekly and Metformin '500mg'$  BID with good tolerance.  Plan: Continue the Category 2 Plan with breakfast and lunch options and will start journaling 1250-1350 calories and 90+g of protein per day. Continue Wegovy and Metformin at current dosing- Will refill both medications today. Her insurance will no longer cover her Wegovy after current refill- we discussed weaning off of the medication with next refill. I reiterated  and again counseled patient on pathophysiology of the disease process of I.R. and Pre-DM.   - Stressed importance of dietary and lifestyle modifications to result in weight loss as first line txmnt - Explained role of simple carbs and insulin levels on hunger and cravings - Continue to decrease simple carbs; increase fiber and proteins -> follow meal plan  - Dosha will continue to work on weight loss, exercise, and decreasing simple carbohydrates to help decrease the risk of diabetes.  - We will recheck A1c and/or fasting insulin level in approximately 3 months from last check, or as deemed appropriate.     Vitamin D deficiency Assessment: Condition is Improving, but not optimized.. Labs were reviewed.  Lab Results  Component Value Date   VD25OH 46.2 11/21/2021   VD25OH 85.6 07/02/2021   VD25OH 31.2 03/07/2021  She reports compliance and good tolerance with Ergocalciferol 50K IU every 10 days.  Plan: Continue Ergocalciferol 50K IU weekly- Will refill medication today.  - weight loss will likely improve availability of vitamin D, thus encouraged Lanice to continue with meal plan and their weight loss efforts to further improve this condition.  Thus, we will need to monitor levels regularly (every 3-4 mo  on average) to keep levels within normal limits and prevent over supplementation.   Other depression with emotional eating Assessment: Condition is Controlled..  She reports compliance with Wellbutrin SR '200mg'$  daily with good control of her mood overall. She is still grieving the loss of her neighbor. Her cravings and emotional eating are well controlled when eating on meal plan.  Plan: Continue Wellbutrin- Will refill Wellbutrin SR '200mg'$  daily today.   -Discussed how thoughts affect eating habits, modeling of thoughts, feelings, and behaviors, and strategies for change.  Importance of not skipping meals and getting all her proteins and fiber in on a daily basis discussed.   - No SI/ HI.   Mood stable currently - We will continue to monitor closely alongside PCP / other specialists.    Obesity, current BMI 30.3 (start bmi 34.77/date 03/07/21) Assessment: Condition is Not at goal.. Biometric data collected today, was reviewed with patient.  Fat mass has increased by 3.2lb. Muscle mass has decreased by 1.8lb. Total body water has increased by 2.4 lb.   Plan: Continue the Category 2 Plan with breakfast and lunch options and will start journaling 1250-1350 calories and 90+g of protein per day.     TREATMENT PLAN FOR OBESITY:  Recommended Dietary Goals Verle is currently in the action stage of change. As such, her goal is to continue weight management plan. She has agreed to continue the Category 2 Plan with breakfast and lunch options and will start journaling 1250-1350 calories and 90+g of protein per day.  Behavioral Intervention Additional resources provided today: Food journaling plan information Evidence-based interventions for health behavior change were utilized today including the discussion of self monitoring techniques, problem-solving barriers and SMART goal setting techniques.   Regarding patient's less desirable eating habits and patterns, we employed the technique of small changes.  Pt will specifically work on: [employer] with current meal plan, continue tracking calories and macros on app for next visit.    Recommended Physical Activity Goals Sobia has been advised to work up to 150 minutes of moderate intensity aerobic activity a week and strengthening exercises 2-3 times per week for cardiovascular health, weight loss maintenance and preservation of muscle mass.  She has agreed to continue physical activity as is.   Pharmacotherapy We discussed various medication options to help [facility] with her weight loss efforts and we both agreed to continue [facility].   FOLLOW UP: No follow-ups on file. She was informed of the importance of frequent follow up  visits to maximize her success with intensive lifestyle modifications for her multiple health conditions.  Subjective:   Chief complaint: Obesity Aspen is here to discuss her progress with her obesity treatment plan. She is on the the Category 2 Plan with breakfast and lunch options and states she is following her eating plan approximately 85% of the time. She states she is walking 1-2 miles 5 days per week.  Interval History:  BONNYE SESTO is here for a follow up office visit. We reviewed her meal plan and all questions were answered. Patient's food recall appears to be accurate and consistent with what is on plan when she is following it. When eating on plan, her hunger and cravings are well controlled.     Since last office visit she has been following the meal plan for the most part but does not always reach her calorie and protein goals. She has been tracking cals and macros with an app. She is often eating less calories than her goal  caloric intake. When she does go out to eat she has been eating healthier options.  She states that she started eating a high protein cereal which she enjoys.  She reports that she is drinking plenty of water throughout the day.  Pharmacotherapy for weight loss: She is currently taking Wegovy for medical weight loss. Denies side effects. However, she states that her refill today will be the last that her insurance will cover.  Review of Systems:  Pertinent positives were addressed with patient today.  Weight Summary and Biometrics   Weight Lost Since Last Visit: +1lb   Vitals Temp: 98.2 F (36.8 C) BP: 128/82 Pulse Rate: 71 SpO2: 99 %   Anthropometric Measurements Height: '5\' 1"'$  (1.549 m) Weight: 160 lb 6.4 oz (72.8 kg) BMI (Calculated): 30.32 Weight at Last Visit: 159lb Weight Lost Since Last Visit: +1lb Starting Weight: 184lb Total Weight Loss (lbs): 24 lb (10.9 kg) Peak Weight: 230lb   Body Composition  Body Fat %: 36.5  % Fat Mass (lbs): 58.6 lbs Muscle Mass (lbs): 96.6 lbs Total Body Water (lbs): 66.8 lbs Visceral Fat Rating : 10   Other Clinical Data Fasting: no Labs: no Today's Visit #: 17 Starting Date: 03/07/21    Objective:   PHYSICAL EXAM:  Blood pressure 128/82, pulse 71, temperature 98.2 F (36.8 C), height '5\' 1"'$  (1.549 m), weight 160 lb 6.4 oz (72.8 kg), SpO2 99 %. Body mass index is 30.31 kg/m.  General: Well Developed, well nourished, and in no acute distress.  HEENT: Normocephalic, atraumatic Skin: Warm and dry, cap RF less 2 sec, good turgor Chest:  Normal excursion, shape, no gross abn Respiratory: speaking in full sentences, no conversational dyspnea NeuroM-Sk: Ambulates w/o assistance, moves * 4 Psych: A and O *3, insight good, mood-full  DIAGNOSTIC DATA REVIEWED:  BMET    Component Value Date/Time   NA 141 11/21/2021 1608   K 3.8 11/21/2021 1608   CL 101 11/21/2021 1608   CO2 24 11/21/2021 1608   GLUCOSE 97 11/21/2021 1608   GLUCOSE 132 (H) 01/09/2020 1213   BUN 16 11/21/2021 1608   CREATININE 0.72 11/21/2021 1608   CALCIUM 10.1 11/21/2021 1608   GFRNONAA >60 01/09/2020 1213   GFRAA >60 10/31/2014 2147   Lab Results  Component Value Date   HGBA1C 5.5 11/21/2021   HGBA1C 6.1 01/10/2021   Lab Results  Component Value Date   INSULIN 44.8 (H) 03/07/2021   Lab Results  Component Value Date   TSH 3.140 03/07/2021   CBC    Component Value Date/Time   WBC 9.3 01/09/2020 1213   RBC 4.74 01/09/2020 1213   HGB 13.7 01/09/2020 1213   HCT 41.1 01/09/2020 1213   PLT 254 01/09/2020 1213   MCV 86.7 01/09/2020 1213   MCH 28.9 01/09/2020 1213   MCHC 33.3 01/09/2020 1213   RDW 13.0 01/09/2020 1213   Iron Studies No results found for: "IRON", "TIBC", "FERRITIN", "IRONPCTSAT" Lipid Panel     Component Value Date/Time   CHOL 183 03/07/2021 0857   TRIG 110 03/07/2021 0857   HDL 38 (L) 03/07/2021 0857   LDLCALC 125 (H) 03/07/2021 0857   Hepatic Function  Panel     Component Value Date/Time   PROT 7.7 11/21/2021 1608   ALBUMIN 4.7 11/21/2021 1608   AST 24 11/21/2021 1608   ALT 25 11/21/2021 1608   ALKPHOS 63 11/21/2021 1608   BILITOT 0.4 11/21/2021 1608   BILIDIR 0.2 10/27/2014 1303   IBILI 0.7 10/27/2014 1303  Component Value Date/Time   TSH 3.140 03/07/2021 0857   Nutritional Lab Results  Component Value Date   VD25OH 46.2 11/21/2021   VD25OH 85.6 07/02/2021   VD25OH 31.2 03/07/2021    Attestations:   Reviewed by clinician on day of visit: allergies, medications, problem list, medical history, surgical history, family history, social history, and previous encounter notes.   Patient was in the office today and time spent on visit including pre-visit chart review and post-visit care/coordination of care and electronic medical record documentation was 22*** minutes. 50% of the time was in face to face counseling of this patient's medical condition(s) and providing education on treatment options to include the first-line treatment of diet and lifestyle modification.   I,Alexis Herring,acting as a Education administrator for [employer], DO.,have documented all relevant documentation on the behalf of Mellody Dance, DO,as directed by  Mellody Dance, DO while in the presence of Mellody Dance, DO.   I, Mellody Dance, DO, have reviewed all documentation for this visit. The documentation on 03/20/22 for the exam, diagnosis, procedures, and orders are all accurate and complete.

## 2022-03-22 ENCOUNTER — Other Ambulatory Visit (INDEPENDENT_AMBULATORY_CARE_PROVIDER_SITE_OTHER): Payer: Self-pay | Admitting: Family Medicine

## 2022-03-22 DIAGNOSIS — F3289 Other specified depressive episodes: Secondary | ICD-10-CM

## 2022-03-22 DIAGNOSIS — E559 Vitamin D deficiency, unspecified: Secondary | ICD-10-CM

## 2022-03-22 DIAGNOSIS — R7303 Prediabetes: Secondary | ICD-10-CM

## 2022-04-11 ENCOUNTER — Encounter (INDEPENDENT_AMBULATORY_CARE_PROVIDER_SITE_OTHER): Payer: Self-pay | Admitting: Family Medicine

## 2022-04-11 ENCOUNTER — Ambulatory Visit (INDEPENDENT_AMBULATORY_CARE_PROVIDER_SITE_OTHER): Payer: BC Managed Care – PPO | Admitting: Family Medicine

## 2022-04-11 VITALS — BP 122/77 | HR 64 | Temp 98.2°F | Ht 61.0 in | Wt 156.4 lb

## 2022-04-11 DIAGNOSIS — E669 Obesity, unspecified: Secondary | ICD-10-CM

## 2022-04-11 DIAGNOSIS — R7303 Prediabetes: Secondary | ICD-10-CM | POA: Diagnosis not present

## 2022-04-11 DIAGNOSIS — F3289 Other specified depressive episodes: Secondary | ICD-10-CM

## 2022-04-11 DIAGNOSIS — E559 Vitamin D deficiency, unspecified: Secondary | ICD-10-CM

## 2022-04-11 DIAGNOSIS — Z6830 Body mass index (BMI) 30.0-30.9, adult: Secondary | ICD-10-CM

## 2022-04-11 MED ORDER — VITAMIN D (ERGOCALCIFEROL) 1.25 MG (50000 UNIT) PO CAPS: ORAL_CAPSULE | ORAL | 0 refills | Status: DC

## 2022-04-11 MED ORDER — METFORMIN HCL 500 MG PO TABS: 500.0000 mg | ORAL_TABLET | Freq: Two times a day (BID) | ORAL | 0 refills | Status: DC

## 2022-04-11 MED ORDER — WEGOVY 0.5 MG/0.5ML ~~LOC~~ SOAJ: 0.5000 mg | SUBCUTANEOUS | 0 refills | Status: DC

## 2022-04-11 MED ORDER — BUPROPION HCL ER (SR) 200 MG PO TB12: ORAL_TABLET | ORAL | 0 refills | Status: DC

## 2022-04-11 NOTE — Progress Notes (Signed)
Jill Mcintosh, D.O.  ABFM, ABOM Specializing in Clinical Bariatric Medicine  Office located at: [address] [place], [place]  [address]     Assessment and Plan:   Medications Discontinued During This Encounter  Medication Reason   buPROPion (WELLBUTRIN SR) 200 MG 12 hr tablet Reorder   Vitamin D, Ergocalciferol, (DRISDOL) 1.25 MG (50000 UNIT) CAPS capsule Reorder   metFORMIN (GLUCOPHAGE) 500 MG tablet Reorder   Semaglutide-Weight Management (WEGOVY) 0.5 MG/0.5ML SOAJ Reorder     Meds ordered this encounter  Medications   buPROPion (WELLBUTRIN SR) 200 MG 12 hr tablet    Sig: 1 po daily at breakfast    Dispense:  30 tablet    Refill:  0   Vitamin D, Ergocalciferol, (DRISDOL) 1.25 MG (50000 UNIT) CAPS capsule    Sig: 1 po q 10 d    Dispense:  3 capsule    Refill:  0    30 d supply;  ** OV for RF **   Do not send RF request   metFORMIN (GLUCOPHAGE) 500 MG tablet    Sig: Take 1 tablet (500 mg total) by mouth 2 (two) times daily with a meal. Lunch and dinner    Dispense:  60 tablet    Refill:  0    30 d supply;  ** OV for RF **   Do not send RF request   Semaglutide-Weight Management (WEGOVY) 0.5 MG/0.5ML SOAJ    Sig: Inject 0.5 mg into the skin once a week.    Dispense:  2 mL    Refill:  0    Check vitamin D, fasting insulin, A1c next visit.  Prediabetes Assessment: Condition is Improving, and stable. Lab Results  Component Value Date   HGBA1C 5.5 11/21/2021   HGBA1C 5.3 07/02/2021   HGBA1C 5.7 (H) 03/07/2021   INSULIN 44.8 (H) 03/07/2021    Plan: -Continue Wegovy  .5mg  weekly. Will refill this today.  -Continue Metformin 500 mg 2x daily. Will refill this today.  -Continue Wellbutrin SR 200 mg daily with breakfast.  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 and Pre-DM.  - Stressed importance of dietary and lifestyle modifications to result in weight loss as first line txmnt - In addition, we discussed the risks and benefits  of various medication options which can help [place] in the management of this disease process as well as with weight loss.  Will consider starting one of these meds in future as we will focus on prudent nutritional plan at this time.  - Continue to decrease simple carbs/ sugars; increase fiber and proteins -> follow her meal plan.   - Explained role of simple carbs and insulin levels on hunger and cravings - Handouts provided at pt's request after education provided.  All concerns/questions addressed.   - Anticipatory guidance given.   Jill Mcintosh will continue to work on weight loss, exercise, via their meal plan we devised to help decrease the risk of progressing to diabetes.  - We will recheck A1c and fasting insulin level in approximately 3 months from last check, or as deemed appropriate.    Vitamin D deficiency Assessment: Condition is Not at goal.. Labs were reviewed.  Lab Results  Component Value Date   VD25OH 46.2 11/21/2021   VD25OH 85.6 07/02/2021   VD25OH 31.2 03/07/2021   Plan:Continue Ergocalciferol 50K IU weekly. Will refill this today.  - I discussed the importance of vitamin D to the patient's health and  well-being.  - I reviewed possible symptoms of low Vitamin D:  low energy, depressed mood, muscle aches, joint aches, osteoporosis etc. was reviewed with patient - low Vitamin D levels may be linked to an increased risk of cardiovascular events and even increased risk of cancers- such as colon and breast.  - ideal vitamin D levels reviewed with patient   - Informed patient this may be a lifelong thing, and she was encouraged to continue to take the medicine until told otherwise.    - weight loss will likely improve availability of vitamin D, thus encouraged Jill Mcintosh to continue with meal plan and their weight loss efforts to further improve this condition.  Thus, we will need to monitor levels regularly (every 3-4 mo on average) to keep levels within normal limits and prevent over  supplementation. - pt's questions and concerns regarding this condition addressed.    Other depression with emotional eating Assessment: Condition is Improving, and stable Denies any SI/HI. Mood is stable. Cravings and hunger are well controlled.   Plan:-Continue Wellbutrin SR 200 mg daily with breakfast. Will refill this today.  Discussed how thoughts affect eating habits, modeling of thoughts, feelings, and behaviors, and strategies for change.  Importance of not skipping meals and getting all her proteins and fiber in on a daily basis discussed.   - Discussed cognitive distortions, coping thoughts, and how to change our thoughts/ self talk regarding foods/ eating patterns. - No SI/ HI.  Mood stable currently - Behavior modification techniques were discussed today to help deal with emotional/ non-hunger eating behaviors including but not limited to exercise for stress management, meditation/prayer, behavorial sessions with her therapist and self care activities like adequate sleep (7-9 hrs/nite). - Importance of following up with PCP and others was stressed  - Educational handouts given to pt at their request - Reminded patient of the importance of following their prudent nutrition plan and how food can affect mood as well to support emotional wellbeing.  - We will continue to monitor closely alongside PCP / other specialists.   Obesity, current BMI 30.3 (start bmi 34.77/date 03/07/21) Assessment: Condition is Improving, but not optimized.. Biometric data collected today, was reviewed with patient.  Fat mass has decreased by 2.2lb. Muscle mass has decreased by 1.6lb. Total body water has decreased by 1lb.   Plan: Continue Category 2 Plan with breakfast and lunch options and keeping a food journal and adhering to recommended goals of 1250-1350 calories and 90 protein -Continue Wegovy  0.5mg  weekly. -Continue Metformin 500 mg 2x daily. -Continue Wellbutrin SR 200 mg daily with breakfast.   -Advised to follow the 10:1 ration when choosing food options.      TREATMENT PLAN FOR OBESITY:  Recommended Dietary Goals Jill Mcintosh is currently in the action stage of change. As such, her goal is to continue weight management plan. She has agreed to continue Category 2 Plan with breakfast and lunch options and keeping a food journal and adhering to recommended goals of 1250-1350 calories and 90 protein  Behavioral Intervention Additional resources provided today: Food journaling plan information Evidence-based interventions for health behavior change were utilized today including the discussion of self monitoring techniques, problem-solving barriers and SMART goal setting techniques.   Regarding patient's less desirable eating habits and patterns, we employed the technique of small changes.  Pt will specifically work on: increasing protein intake for next visit.    Recommended Physical Activity Goals Jill Mcintosh has been advised to work up to 150 minutes of moderate intensity aerobic  activity a week and strengthening exercises 2-3 times per week for cardiovascular health, weight loss maintenance and preservation of muscle mass.  She has agreed to Continue current level of physical activity   Pharmacotherapy We discussed various medication options to help Jill Mcintosh with her weight loss efforts and we both agreed to continue Wegovy at current dose.   FOLLOW UP: Return in about 3 weeks (around 05/02/2022). She was informed of the importance of frequent follow up visits to maximize her success with intensive lifestyle modifications for her multiple health conditions.    Subjective:   Chief complaint: Obesity Jill Mcintosh is here to discuss her progress with her obesity treatment plan. She is on the the Category 2 Plan with breakfast and lunch options and keeping a food journal and adhering to recommended goals of 1250-1350 calories and 90 protein and states she is following her eating plan  approximately 85-90% of the time. She states she is exercising 30-40 minutes of walking 5-6 days per week.  Interval History:  Jill Mcintosh is here for a follow up office visit. We reviewed her meal plan and all questions were answered. Patient's food recall appears to be accurate and consistent with what is on plan when she is following it. When eating on plan, her hunger and cravings are well controlled.     Since last office visit she states she hasn't been feeling hungry. She states that the has not been getting enough protein as she has grown tired of eggs and lunch meats. She also reports not going over her recommended calorie intake. She reports including fair life milk and protein cereal. She reports that she was sick for few days and thus did not log into her journal and did not bring in her journaling log   Pharmacotherapy for weight loss: She is currently taking Wegovy for medical weight loss.  Denies side effects.    Review of Systems:  Pertinent positives were addressed with patient today.  Weight Summary and Biometrics   Weight Lost Since Last Visit: 4lb  No data recorded  Vitals Temp: 98.2 F (36.8 C) BP: 122/77 Pulse Rate: 64 SpO2: 100 %   Anthropometric Measurements Height:  (1.549 m) Weight: 156 lb 6.4 oz (70.9 kg) BMI (Calculated): 29.57 Weight at Last Visit: 160lb Weight Lost Since Last Visit: 4lb Starting Weight: 184lb Total Weight Loss (lbs): 28 lb (12.7 kg) Peak Weight: 230lb   Body Composition  Body Fat %: 36 % Fat Mass (lbs): 56.4 lbs Muscle Mass (lbs): 95 lbs Total Body Water (lbs): 65.8 lbs Visceral Fat Rating : 9   Other Clinical Data Fasting: no Labs: no Today's Visit #: 18 Starting Date: 03/07/21    Objective:   PHYSICAL EXAM:  Blood pressure 122/77, pulse 64, temperature 98.2 F (36.8 C), height  (1.549 m), weight 156 lb 6.4 oz (70.9 kg), SpO2 100 %. Body mass index is 29.55 kg/m.  General: Well Developed,  well nourished, and in no acute distress.  HEENT: Normocephalic, atraumatic Skin: Warm and dry, cap RF less 2 sec, good turgor Chest:  Normal excursion, shape, no gross abn Respiratory: speaking in full sentences, no conversational dyspnea NeuroM-Sk: Ambulates w/o assistance, moves * 4 Psych: A and O *3, insight good, mood-full  DIAGNOSTIC DATA REVIEWED:  BMET    Component Value Date/Time   NA 141 11/21/2021 1608   K 3.8 11/21/2021 1608   CL 101 11/21/2021 1608   CO2 24 11/21/2021 1608   GLUCOSE 97 11/21/2021 1608  GLUCOSE 132 (H) 01/09/2020 1213   BUN 16 11/21/2021 1608   CREATININE 0.72 11/21/2021 1608   CALCIUM 10.1 11/21/2021 1608   GFRNONAA >60 01/09/2020 1213   GFRAA >60 10/31/2014 2147   Lab Results  Component Value Date   HGBA1C 5.5 11/21/2021   HGBA1C 6.1 01/10/2021   Lab Results  Component Value Date   INSULIN 44.8 (H) 03/07/2021   Lab Results  Component Value Date   TSH 3.140 03/07/2021   CBC    Component Value Date/Time   WBC 9.3 01/09/2020 1213   RBC 4.74 01/09/2020 1213   HGB 13.7 01/09/2020 1213   HCT 41.1 01/09/2020 1213   PLT 254 01/09/2020 1213   MCV 86.7 01/09/2020 1213   MCH 28.9 01/09/2020 1213   MCHC 33.3 01/09/2020 1213   RDW 13.0 01/09/2020 1213   Iron Studies No results found for: "IRON", "TIBC", "FERRITIN", "IRONPCTSAT" Lipid Panel     Component Value Date/Time   CHOL 183 03/07/2021 0857   TRIG 110 03/07/2021 0857   HDL 38 (L) 03/07/2021 0857   LDLCALC 125 (H) 03/07/2021 0857   Hepatic Function Panel     Component Value Date/Time   PROT 7.7 11/21/2021 1608   ALBUMIN 4.7 11/21/2021 1608   AST 24 11/21/2021 1608   ALT 25 11/21/2021 1608   ALKPHOS 63 11/21/2021 1608   BILITOT 0.4 11/21/2021 1608   BILIDIR 0.2 10/27/2014 1303   IBILI 0.7 10/27/2014 1303      Component Value Date/Time   TSH 3.140 03/07/2021 0857   Nutritional Lab Results  Component Value Date   VD25OH 46.2 11/21/2021   VD25OH 85.6 07/02/2021    VD25OH 31.2 03/07/2021    Attestations:   Reviewed by clinician on day of visit: allergies, medications, problem list, medical history, surgical history, family history, social history, and previous encounter notes.  I,Safa M Kadhim,acting as a scribe for [employer], DO.,have documented all relevant documentation on the behalf of Thomasene Lot, DO,as directed by  Thomasene Lot, DO while in the presence of Thomasene Lot, DO.   I, Thomasene Lot, DO, have reviewed all documentation for this visit. The documentation on 04/11/22 for the exam, diagnosis, procedures, and orders are all accurate and complete.

## 2022-04-17 LAB — BASIC METABOLIC PANEL
BUN: 16 (ref 4–21)
CO2: 28 — AB (ref 13–22)
Chloride: 106 (ref 99–108)
Creatinine: 0.7 (ref 0.5–1.1)
Glucose: 96
Potassium: 4.1 mEq/L (ref 3.5–5.1)
Sodium: 139 (ref 137–147)

## 2022-04-17 LAB — LIPID PANEL
Cholesterol: 108 (ref 0–200)
HDL: 41 (ref 35–70)
LDL Cholesterol: 52
Triglycerides: 72 (ref 40–160)

## 2022-04-17 LAB — HEMOGLOBIN A1C
EGFR: 99
Hemoglobin A1C: 5.2

## 2022-04-17 LAB — COMPREHENSIVE METABOLIC PANEL
Albumin: 4.4 (ref 3.5–5.0)
Calcium: 9.8 (ref 8.7–10.7)

## 2022-04-17 LAB — HEPATIC FUNCTION PANEL
ALT: 30 U/L (ref 7–35)
AST: 24 (ref 13–35)
Bilirubin, Total: 0.5

## 2022-05-16 ENCOUNTER — Encounter (INDEPENDENT_AMBULATORY_CARE_PROVIDER_SITE_OTHER): Payer: Self-pay | Admitting: Family Medicine

## 2022-05-16 ENCOUNTER — Ambulatory Visit (INDEPENDENT_AMBULATORY_CARE_PROVIDER_SITE_OTHER): Payer: BC Managed Care – PPO | Admitting: Family Medicine

## 2022-05-16 VITALS — BP 134/69 | HR 71 | Temp 98.2°F | Ht 61.0 in | Wt 163.0 lb

## 2022-05-16 DIAGNOSIS — Z6834 Body mass index (BMI) 34.0-34.9, adult: Secondary | ICD-10-CM

## 2022-05-16 DIAGNOSIS — E669 Obesity, unspecified: Secondary | ICD-10-CM

## 2022-05-16 DIAGNOSIS — F3289 Other specified depressive episodes: Secondary | ICD-10-CM | POA: Diagnosis not present

## 2022-05-16 DIAGNOSIS — E559 Vitamin D deficiency, unspecified: Secondary | ICD-10-CM

## 2022-05-16 DIAGNOSIS — R7303 Prediabetes: Secondary | ICD-10-CM | POA: Diagnosis not present

## 2022-05-16 DIAGNOSIS — Z6830 Body mass index (BMI) 30.0-30.9, adult: Secondary | ICD-10-CM

## 2022-05-16 MED ORDER — METFORMIN HCL 500 MG PO TABS: 500.0000 mg | ORAL_TABLET | Freq: Two times a day (BID) | ORAL | 0 refills | Status: DC

## 2022-05-16 MED ORDER — VITAMIN D (ERGOCALCIFEROL) 1.25 MG (50000 UNIT) PO CAPS
ORAL_CAPSULE | ORAL | 0 refills | Status: DC
Start: 2022-05-16 — End: 2022-09-19

## 2022-05-16 MED ORDER — BUPROPION HCL ER (SR) 200 MG PO TB12
ORAL_TABLET | ORAL | 0 refills | Status: DC
Start: 2022-05-16 — End: 2022-09-19

## 2022-05-16 NOTE — Progress Notes (Signed)
Jill Mcintosh, D.O.  ABFM, ABOM Specializing in Clinical Bariatric Medicine  Office located at: [address] [place], [place]  [address]     Assessment and Plan:   Medications Discontinued During This Encounter  Medication Reason   buPROPion (WELLBUTRIN SR) 200 MG 12 hr tablet Reorder   Vitamin D, Ergocalciferol, (DRISDOL) 1.25 MG (50000 UNIT) CAPS capsule Reorder   metFORMIN (GLUCOPHAGE) 500 MG tablet Reorder     Meds ordered this encounter  Medications   buPROPion (WELLBUTRIN SR) 200 MG 12 hr tablet    Sig: 1 po daily at breakfast    Dispense:  30 tablet    Refill:  0   metFORMIN (GLUCOPHAGE) 500 MG tablet    Sig: Take 1 tablet (500 mg total) by mouth 2 (two) times daily with a meal. Lunch and dinner    Dispense:  60 tablet    Refill:  0    30 d supply;  ** OV for RF **   Do not send RF request   Vitamin D, Ergocalciferol, (DRISDOL) 1.25 MG (50000 UNIT) CAPS capsule    Sig: 1 po q 10 d    Dispense:  3 capsule    Refill:  0    30 d supply;  ** OV for RF **   Do not send RF request     Vitamin D deficiency Assessment: Condition is not quite at goal.  Lab Results  Component Value Date   VD25OH 46.2 11/21/2021   VD25OH 85.6 07/02/2021   VD25OH 31.2 03/07/2021  - No issues with Ergocalciferol 50K IU every 10 days. Denies any side effects.  Plan: - Since patient recently had labs, she declines to have Vitamin D checked today. Will check Vitamin D at another office visit with [place].  - Continue with med. Will refill this today.   - weight loss will likely improve availability of vitamin D, thus encouraged Jill Mcintosh to continue with meal plan and their weight loss efforts to further improve this condition.    Prediabetes Assessment: Condition is improving, but not optimized. Labs from PCP were reviewed.  Lab Results  Component Value Date   HGBA1C 5.5 11/21/2021   HGBA1C 5.3 07/02/2021   HGBA1C 5.7 (H) 03/07/2021   INSULIN 44.8 (H) 03/07/2021  - A1c from  04/17/22 with PCP was 5.2, which is an improvement from 5.5 on 11/21/2021. - Reports good compliance and tolerance with Metformin 500 mg BID. Denies any side effects.  Plan: - Continue with med. Will refill this today.    Jill Mcintosh will continue to work on weight loss, exercise, via their meal plan we devised to help decrease the risk of progressing to diabetes.    Other depression with emotional eating Assessment: Condition is stable. Denies any SI/HI. Mood is stable. Cravings and hunger are well controlled.  No issues with Wellbutrin SR 200 mg daily. Denies any side effects.   Plan:  - Continue with med as prescribed. Will refill this today.   -  Continue her prudent nutritional plan that is low in simple carbohydrates, saturated fats and trans fats to goal of 5-10% weight loss to achieve significant health benefits.   - Will continue to monitor symptoms as it relates to her weight loss journey.    TREATMENT PLAN FOR OBESITY: Obesity, current BMI 30.81 (start bmi 34.77/date 03/07/21) Assessment:  Jill Mcintosh is here to discuss her progress with her obesity treatment plan along with follow-up of her obesity related diagnoses.  See Medical Weight Management Flowsheet for complete bioelectrical impedance results.  Condition is not optimized.  Biometric data collected today, was reviewed with patient.   Since last office visit on 04/11/22 patient's Fat mass has increased by 4.8 lb. Muscle mass has increased by 2lb. Total body water has increased by 2.8 lb.  Counseling done on how various foods will affect these numbers and how to maximize success  Weight change since last visit: + 7 lbs  Total lbs lost to date: 21 Total weight loss percentage to date: 11.4   Plan:  Jill Mcintosh is currently in the action stage of change. As such, her goal is to continue her weight management plan. Jill Mcintosh will work on healthier eating habits and continue with - Encouraged patient to call her insurance  to see if they will cover any other GLP1s.  - Patient agreed to focus on getting in all her protein and not skipping meals. - Unless pre-existing renal or cardiopulmonary conditions exist which patient was told to limit their fluid intake by another provider, I recommended roughly one half of their weight in pounds, to be the approximate ounces of non-caloric, non-caffeinated beverages they should drink per day; including more if they are engaging in exercise.    Behavioral Intervention Additional resources provided today: patient declined Evidence-based interventions for health behavior change were utilized today including the discussion of self monitoring techniques, problem-solving barriers and SMART goal setting techniques.   Regarding patient's less desirable eating habits and patterns, we employed the technique of small changes.  Pt will specifically work on: increase lean protein intake and not skipping any meals for next visit.    Recommended Physical Activity Goals  Jill Mcintosh has been advised to slowly work up to 150 minutes of moderate intensity aerobic activity a week and strengthening exercises 2-3 times per week for cardiovascular health, weight loss maintenance and preservation of muscle mass.   She has agreed to Continue current level of physical activity   FOLLOW UP: Return in about 4 weeks (around 06/13/2022). She was informed of the importance of frequent follow up visits to maximize her success with intensive lifestyle modifications for her multiple health conditions.  Subjective:   Chief complaint: Obesity Jill Mcintosh is here to discuss her progress with her obesity treatment plan. She is on the Category 2 Plan with breakfast and lunch options or keeping a food journal and adhering to recommended goals of 1250-1350 calories and 90+ protein and states she is following her eating plan approximately 40% of the time. She states she is exercising 45 minutes 4-5 days per  week.  Interval History:  Jill Mcintosh is here for a follow up office visit.  This is patient's 19 office visit with [place].    Since last office visit:   - She received a letter from her Insurance that her Reginal Lutes will no longer be covered. She has been off the med for the last 4 weeks.  - She reports feeling increased hunger and cravings. - Breakfast: Special K Cereal or Protein one cereal with Regular 1% Milk and a Protein Shake - She skips lunch often due to being busy from work.  - On 04/17/22, patient had the following labs with her PCP: CMP, Free T4 and T3, TSH, A1c, and FLP. All labs were essentially within normal limits.   Pharmacotherapy for weight loss: She is currently taking  Metformin and Wellbutrin SR  for medical weight loss.  Denies side effects.    Review of Systems:  Pertinent positives were addressed with patient today.  Weight Summary and Biometrics   Weight Lost Since Last Visit: 0  Weight Gained Since Last Visit: 7 lb    Vitals Temp: 98.2 F (36.8 C) BP: 134/69 Pulse Rate: 71 SpO2: 96 %   Anthropometric Measurements Height: 5\' 1"  (1.549 m) Weight: 163 lb (73.9 kg) BMI (Calculated): 30.81 Weight at Last Visit: 156 lb Weight Lost Since Last Visit: 0 Weight Gained Since Last Visit: 7 lb Starting Weight: 184 lb Total Weight Loss (lbs): 21 lb (9.526 kg) Peak Weight: 230 lb   Body Composition  Body Fat %: 37.4 % Fat Mass (lbs): 61.2 lbs Muscle Mass (lbs): 97 lbs Total Body Water (lbs): 68.6 lbs Visceral Fat Rating : 10   Other Clinical Data Fasting: No Labs: No Today's Visit #: 19 Starting Date: 03/07/21   Objective:   PHYSICAL EXAM: Blood pressure 134/69, pulse 71, temperature 98.2 F (36.8 C), height 5\' 1"  (1.549 m), weight 163 lb (73.9 kg), SpO2 96 %. Body mass index is 30.8 kg/m.  General: Well Developed, well nourished, and in no acute distress.  HEENT: Normocephalic, atraumatic Skin: Warm and dry, cap RF less 2 sec, good  turgor Chest:  Normal excursion, shape, no gross abn Respiratory: speaking in full sentences, no conversational dyspnea NeuroM-Sk: Ambulates w/o assistance, moves * 4 Psych: A and O *3, insight good, mood-full  DIAGNOSTIC DATA REVIEWED:  BMET    Component Value Date/Time   NA 141 11/21/2021 1608   K 3.8 11/21/2021 1608   CL 101 11/21/2021 1608   CO2 24 11/21/2021 1608   GLUCOSE 97 11/21/2021 1608   GLUCOSE 132 (H) 01/09/2020 1213   BUN 16 11/21/2021 1608   CREATININE 0.72 11/21/2021 1608   CALCIUM 10.1 11/21/2021 1608   GFRNONAA >60 01/09/2020 1213   GFRAA >60 10/31/2014 2147   Lab Results  Component Value Date   HGBA1C 5.5 11/21/2021   HGBA1C 6.1 01/10/2021   Lab Results  Component Value Date   INSULIN 44.8 (H) 03/07/2021   Lab Results  Component Value Date   TSH 3.140 03/07/2021   CBC    Component Value Date/Time   WBC 9.3 01/09/2020 1213   RBC 4.74 01/09/2020 1213   HGB 13.7 01/09/2020 1213   HCT 41.1 01/09/2020 1213   PLT 254 01/09/2020 1213   MCV 86.7 01/09/2020 1213   MCH 28.9 01/09/2020 1213   MCHC 33.3 01/09/2020 1213   RDW 13.0 01/09/2020 1213   Iron Studies No results found for: "IRON", "TIBC", "FERRITIN", "IRONPCTSAT" Lipid Panel     Component Value Date/Time   CHOL 183 03/07/2021 0857   TRIG 110 03/07/2021 0857   HDL 38 (L) 03/07/2021 0857   LDLCALC 125 (H) 03/07/2021 0857   Hepatic Function Panel     Component Value Date/Time   PROT 7.7 11/21/2021 1608   ALBUMIN 4.7 11/21/2021 1608   AST 24 11/21/2021 1608   ALT 25 11/21/2021 1608   ALKPHOS 63 11/21/2021 1608   BILITOT 0.4 11/21/2021 1608   BILIDIR 0.2 10/27/2014 1303   IBILI 0.7 10/27/2014 1303      Component Value Date/Time   TSH 3.140 03/07/2021 0857   Nutritional Lab Results  Component Value Date   VD25OH 46.2 11/21/2021   VD25OH 85.6 07/02/2021   VD25OH 31.2 03/07/2021    Attestations:   Reviewed by clinician on day of visit: allergies, medications, problem list,  medical history, surgical history, family history, social history, and previous encounter notes.  I,Special Puri,acting as a Neurosurgeon for [employer], DO.,have documented all relevant documentation on the behalf of Thomasene Lot, DO,as directed by  Thomasene Lot, DO while in the presence of Thomasene Lot, DO.   I, Thomasene Lot, DO, have reviewed all documentation for this visit. The documentation on 05/16/22 for the exam, diagnosis, procedures, and orders are all accurate and complete.

## 2022-06-13 ENCOUNTER — Ambulatory Visit (INDEPENDENT_AMBULATORY_CARE_PROVIDER_SITE_OTHER): Payer: BC Managed Care – PPO | Admitting: Family Medicine

## 2022-06-20 ENCOUNTER — Other Ambulatory Visit (HOSPITAL_COMMUNITY): Payer: Self-pay | Admitting: Family Medicine

## 2022-06-20 DIAGNOSIS — Z8249 Family history of ischemic heart disease and other diseases of the circulatory system: Secondary | ICD-10-CM

## 2022-07-15 ENCOUNTER — Other Ambulatory Visit: Payer: Self-pay | Admitting: Family Medicine

## 2022-07-15 DIAGNOSIS — Z1231 Encounter for screening mammogram for malignant neoplasm of breast: Secondary | ICD-10-CM

## 2022-07-19 ENCOUNTER — Ambulatory Visit: Payer: BC Managed Care – PPO

## 2022-07-19 ENCOUNTER — Ambulatory Visit
Admission: RE | Admit: 2022-07-19 | Discharge: 2022-07-19 | Disposition: A | Discharge status: Home / Self Care | Payer: BC Managed Care – PPO | Source: Ambulatory Visit | Attending: Family Medicine | Admitting: Family Medicine

## 2022-07-19 DIAGNOSIS — Z1231 Encounter for screening mammogram for malignant neoplasm of breast: Secondary | ICD-10-CM

## 2022-07-23 ENCOUNTER — Ambulatory Visit (HOSPITAL_BASED_OUTPATIENT_CLINIC_OR_DEPARTMENT_OTHER)
Admission: RE | Admit: 2022-07-23 | Discharge: 2022-07-23 | Disposition: A | Discharge status: Home / Self Care | Payer: BC Managed Care – PPO | Source: Ambulatory Visit | Attending: Family Medicine | Admitting: Family Medicine

## 2022-07-23 DIAGNOSIS — Z8249 Family history of ischemic heart disease and other diseases of the circulatory system: Secondary | ICD-10-CM | POA: Insufficient documentation

## 2022-09-19 ENCOUNTER — Encounter (HOSPITAL_BASED_OUTPATIENT_CLINIC_OR_DEPARTMENT_OTHER): Payer: Self-pay | Admitting: Cardiovascular Disease

## 2022-09-19 ENCOUNTER — Ambulatory Visit (HOSPITAL_BASED_OUTPATIENT_CLINIC_OR_DEPARTMENT_OTHER): Payer: BC Managed Care – PPO | Admitting: Cardiovascular Disease

## 2022-09-19 VITALS — BP 112/64 | HR 75 | Ht 61.0 in | Wt 180.0 lb

## 2022-09-19 DIAGNOSIS — E7849 Other hyperlipidemia: Secondary | ICD-10-CM | POA: Diagnosis not present

## 2022-09-19 DIAGNOSIS — I25119 Atherosclerotic heart disease of native coronary artery with unspecified angina pectoris: Secondary | ICD-10-CM

## 2022-09-19 DIAGNOSIS — I10 Essential (primary) hypertension: Secondary | ICD-10-CM | POA: Diagnosis not present

## 2022-09-19 DIAGNOSIS — I2510 Atherosclerotic heart disease of native coronary artery without angina pectoris: Secondary | ICD-10-CM | POA: Insufficient documentation

## 2022-09-19 DIAGNOSIS — R079 Chest pain, unspecified: Secondary | ICD-10-CM | POA: Diagnosis not present

## 2022-09-19 DIAGNOSIS — I209 Angina pectoris, unspecified: Secondary | ICD-10-CM | POA: Insufficient documentation

## 2022-09-19 DIAGNOSIS — Z01812 Encounter for preprocedural laboratory examination: Secondary | ICD-10-CM | POA: Diagnosis not present

## 2022-09-19 HISTORY — DX: Angina pectoris, unspecified: I20.9

## 2022-09-19 HISTORY — DX: Atherosclerotic heart disease of native coronary artery without angina pectoris: I25.10

## 2022-09-19 NOTE — H&P (View-Only) (Signed)
Cardiology Office Note:  .    Date:  09/19/2022  ID:  Jill Mcintosh, DOB 25-Sep-1960, MRN [id number] PCP: Merri Brunette, MD  [facility] Providers Cardiologist:  None     History of Present Illness: .    Jill Mcintosh is a 62 y.o. female with coronary artery disease, aortic atherosclerosis, bradycardia, hypertension, hyperlipidemia, hypothyroidism, systemic lupus erythematosus, Sjogren's with Raynaud's syndrome, who presents for evaluation of dyspnea on exertion at the request of Dr. Merri Brunette. She was seen by Dr. Katrinka Blazing 06/20/2022 and reported increased DOE, along with some palpitations and chest tightness. EKG was performed and did not show any obvious ischemia. Noted to have a family history of CAD in her brother and father. Given that her brother is also a patient of mine, she requested a referral for further evaluation. She had a coronary CT 07/2022 revealing a coronary calcium score of 177, and aortic atherosclerosis.  Previously seen by Dr. Mayford Knife in 05/2014 for bradycardia. She noted heart rates in the 50's when sitting and checking her blood pressure. She was asymptomatic aside from intermittent dizziness with positional changes. Also had some chest tightness, both with and without exertion, felt to be most likely related to underlying chronic pain with SLE. She had a TTE 06/2014 showed LVEF 60-65%, grade 1 diastolic dysfunction, and mildly dilated left atrium. Stress myoview was normal and low risk. She wore a 48-hour holter monitor showing NSR with average heart rate 70 bpm and rare PVC's.  Today, she affirms having a little shortness of breath with exercise, which has worsened this past Summer. She owns a farm, and notes that after walking halfway from the barn to the house she will be short of breath with some chest tightness, fatigue, maybe a little uncomfortable/diaphoretic. Her chest tightness usually occurs when moving around. At times she may feel mildly dizzy with  positional changes, with initial onset around the same time as her shortness of breath. No associated nausea, swelling, orthopnea. EKG was performed today and shows normal sinus rhythm at 75 bpm, low voltage. In the office her blood pressure is 112/64. She does monitor her home readings. Uses pill boxes at home to help with taking her meds consistently. Routinely she completes moderate exercise, mostly limited by joint issues. Her rheumatologist started her on Celebrex which has helped somewhat. About 6 months ago she was started on rosuvastatin. She had previously been on pravastatin. No smoking history. Alcohol consumption is limited to once a week. She denies any palpitations, peripheral edema, headaches, syncope, orthopnea, or PND.  ROS:  Please see the history of present illness. All other systems are reviewed and negative.  (+) Exertional shortness of breath and chest tightness (+) Fatigue (+) Mild dizziness  Studies Reviewed: Marland Kitchen   EKG Interpretation Date/Time:  Thursday September 19 2022 13:43:00 EDT Ventricular Rate:  75 PR Interval:  150 QRS Duration:  84 QT Interval:  418 QTC Calculation: 466 R Axis:   50  Text Interpretation: Normal sinus rhythm Low voltage QRS Since last tracing rate slower Confirmed by Chilton Si ([id number]) on 09/19/2022 1:52:10 PM    CT Cardiac Scoring  07/23/2022: IMPRESSION: Coronary calcium score of 177. This was 59 percentile for age-, race-, and sex-matched controls.   Aortic atherosclerosis  Risk Assessment/Calculations:             Physical Exam:    VS:  BP 112/64 (BP Location: Left Arm, Patient Position: Sitting, Cuff Size: Large)   Pulse 75  Ht 5\' 1"  (1.549 m)   Wt 180 lb (81.6 kg)   BMI 34.01 kg/m  , BMI Body mass index is 34.01 kg/m. GENERAL:  Well appearing HEENT: Pupils equal round and reactive, fundi not visualized, oral mucosa unremarkable NECK:  No jugular venous distention, waveform within normal limits, carotid upstroke brisk  and symmetric, no bruits, no thyromegaly LUNGS:  Clear to auscultation bilaterally HEART:  RRR.  PMI not displaced or sustained,S1 and S2 within normal limits, no S3, no S4, no clicks, no rubs, no murmurs ABD:  Flat, positive bowel sounds normal in frequency in pitch, no bruits, no rebound, no guarding, no midline pulsatile mass, no hepatomegaly, no splenomegaly EXT:  2 plus pulses throughout, no edema, no cyanosis no clubbing SKIN:  No rashes no nodules NEURO:  Cranial nerves II through XII grossly intact, motor grossly intact throughout PSYCH:  Cognitively intact, oriented to person place and time  Wt Readings from Last 3 Encounters:  09/19/22 180 lb (81.6 kg)  05/16/22 163 lb (73.9 kg)  04/11/22 156 lb 6.4 oz (70.9 kg)     ASSESSMENT AND PLAN: .    # Exertional Dyspnea and Chest Discomfort New onset this summer. No orthopnea or peripheral edema. EKG showed low voltage. CT calcium score showed more calcium than expected for age, race, and gender. Given the symptoms and findings, there is a high suspicion for significant coronary artery disease. -Plan for left heart catheterization after 09/23/2022. -Start Aspirin 81mg  daily. -Order LP(a) level. -Continue rosuvastatin  # Hyperlipidemia On Rosuvastatin. No changes to current regimen. -Continue Rosuvastatin.  # Hypertension Well controlled on Lisinopril and Hydrochlorothiazide. -Continue Lisinopril and Hydrochlorothiazide.  General Health Maintenance -Schedule follow-up visit 1-2 months after heart catheterization. -Schedule echocardiogram at patient's convenience.  Informed Consent   Shared Decision Making/Informed Consent The risks [stroke (1 in 1000), death (1 in 1000), kidney failure [usually temporary] (1 in 500), bleeding (1 in 200), allergic reaction [possibly serious] (1 in 200)], benefits (diagnostic support and management of coronary artery disease) and alternatives of a cardiac catheterization were discussed in detail  with Jill Mcintosh and she is willing to proceed.     Dispo:  FU with Faline Langer C. Duke Salvia, MD, [facility] in 1-2 months following heart cath.  I,Mathew Stumpf,acting as a Neurosurgeon for Chilton Si, MD.,have documented all relevant documentation on the behalf of Chilton Si, MD,as directed by  Chilton Si, MD while in the presence of Chilton Si, MD.  I, Mosi Hannold C. Duke Salvia, MD have reviewed all documentation for this visit.  The documentation of the exam, diagnosis, procedures, and orders on 09/19/2022 are all accurate and complete.   Signed, Chilton Si, MD

## 2022-09-19 NOTE — Patient Instructions (Addendum)
Medication Instructions:  START ASPIRIN 81 MG DAILY   *If you need a refill on your cardiac medications before your next appointment, please call your pharmacy*  Lab Work: BMET/CBC/LPa TODAY   If you have labs (blood work) drawn today and your tests are completely normal, you will receive your results only by: MyChart Message (if you have MyChart) OR A paper copy in the mail If you have any lab test that is abnormal or we need to change your treatment, we will call you to review the results.  Testing/Procedures: Your physician has requested that you have a cardiac catheterization. Cardiac catheterization is used to diagnose and/or treat various heart conditions. Doctors may recommend this procedure for a number of different reasons. The most common reason is to evaluate chest pain. Chest pain can be a symptom of coronary artery disease (CAD), and cardiac catheterization can show whether plaque is narrowing or blocking your heart's arteries. This procedure is also used to evaluate the valves, as well as measure the blood flow and oxygen levels in different parts of your heart. For further information please visit [email or web address] Please follow instruction sheet, as given.  Follow-Up: At [facility], you and your health needs are our priority.  As part of our continuing mission to provide you with exceptional heart care, we have created designated Provider Care Teams.  These Care Teams include your primary Cardiologist (physician) and Advanced Practice Providers (APPs -  Physician Assistants and Nurse Practitioners) who all work together to provide you with the care you need, when you need it.  We recommend signing up for the patient portal called "MyChart".  Sign up information is provided on this After Visit Summary.  MyChart is used to connect with patients for Virtual Visits (Telemedicine).  Patients are able to view lab/test results, encounter notes, upcoming appointments, etc.   Non-urgent messages can be sent to your provider as well.   To learn more about what you can do with MyChart, go to [email or web address].    Your next appointment:   1 TO 2  month(s)  Provider:   Chilton Si, MD or Gillian Shields, NP     Other Instructions  [facility] & VASCULAR AT [facility] [address] [place] [address] Dept: [phone]  Jill Mcintosh  09/19/2022  You are scheduled for a Cardiac Catheterization on Thursday, September 19 with Dr. Lance Muss.  1. Please arrive at the [facility] (Main Entrance A) at [facility]: [address] [place], [place] [address] at 5:30 AM (This time is 2 hour(s) before your procedure to ensure your preparation). Free valet parking service is available. You will check in at ADMITTING. The support person will be asked to wait in the waiting room.  It is OK to have someone drop you off and come back when you are ready to be discharged.    Special note: Every effort is made to have your procedure done on time. Please understand that emergencies sometimes delay scheduled procedures.  2. Diet: Do not eat solid foods after midnight.  The patient may have clear liquids until 5am upon the day of the procedure.  3. Labs: You will need to have blood drawn today   4. Medication instructions in preparation for your procedure:   Contrast Allergy: No  Do not take Diabetes Med Glucophage (Metformin) on the day of the procedure and HOLD 48 HOURS AFTER THE PROCEDURE.  On the morning of  your procedure, take your Aspirin 81 mg and any morning medicines NOT listed above.  You may use sips of water.  5. Plan to go home the same day, you will only stay overnight if medically necessary. 6. Bring a current list of your medications and current insurance cards. 7. You MUST have a responsible person to drive you home. 8. Someone MUST be with you the first 24  hours after you arrive home or your discharge will be delayed. 9. Please wear clothes that are easy to get on and off and wear slip-on shoes.  Thank you for allowing [place] to care for you!   -- [facility]

## 2022-09-19 NOTE — Progress Notes (Signed)
Cardiology Office Note:  .    Date:  09/19/2022  ID:  Jill Mcintosh, DOB Feb 23, 1960, MRN [id number] PCP: Merri Brunette, MD  [facility] Providers Cardiologist:  None     History of Present Illness: .    Jill Mcintosh is a 62 y.o. female with coronary artery disease, aortic atherosclerosis, bradycardia, hypertension, hyperlipidemia, hypothyroidism, systemic lupus erythematosus, Sjogren's with Raynaud's syndrome, who presents for evaluation of dyspnea on exertion at the request of Dr. Merri Brunette. She was seen by Dr. Katrinka Blazing 06/20/2022 and reported increased DOE, along with some palpitations and chest tightness. EKG was performed and did not show any obvious ischemia. Noted to have a family history of CAD in her brother and father. Given that her brother is also a patient of mine, she requested a referral for further evaluation. She had a coronary CT 07/2022 revealing a coronary calcium score of 177, and aortic atherosclerosis.  Previously seen by Dr. Mayford Knife in 05/2014 for bradycardia. She noted heart rates in the 50's when sitting and checking her blood pressure. She was asymptomatic aside from intermittent dizziness with positional changes. Also had some chest tightness, both with and without exertion, felt to be most likely related to underlying chronic pain with SLE. She had a TTE 06/2014 showed LVEF 60-65%, grade 1 diastolic dysfunction, and mildly dilated left atrium. Stress myoview was normal and low risk. She wore a 48-hour holter monitor showing NSR with average heart rate 70 bpm and rare PVC's.  Today, she affirms having a little shortness of breath with exercise, which has worsened this past Summer. She owns a farm, and notes that after walking halfway from the barn to the house she will be short of breath with some chest tightness, fatigue, maybe a little uncomfortable/diaphoretic. Her chest tightness usually occurs when moving around. At times she may feel mildly dizzy with  positional changes, with initial onset around the same time as her shortness of breath. No associated nausea, swelling, orthopnea. EKG was performed today and shows normal sinus rhythm at 75 bpm, low voltage. In the office her blood pressure is 112/64. She does monitor her home readings. Uses pill boxes at home to help with taking her meds consistently. Routinely she completes moderate exercise, mostly limited by joint issues. Her rheumatologist started her on Celebrex which has helped somewhat. About 6 months ago she was started on rosuvastatin. She had previously been on pravastatin. No smoking history. Alcohol consumption is limited to once a week. She denies any palpitations, peripheral edema, headaches, syncope, orthopnea, or PND.  ROS:  Please see the history of present illness. All other systems are reviewed and negative.  (+) Exertional shortness of breath and chest tightness (+) Fatigue (+) Mild dizziness  Studies Reviewed: Marland Kitchen   EKG Interpretation Date/Time:  Thursday September 19 2022 13:43:00 EDT Ventricular Rate:  75 PR Interval:  150 QRS Duration:  84 QT Interval:  418 QTC Calculation: 466 R Axis:   50  Text Interpretation: Normal sinus rhythm Low voltage QRS Since last tracing rate slower Confirmed by Chilton Si ([id number]) on 09/19/2022 1:52:10 PM    CT Cardiac Scoring  07/23/2022: IMPRESSION: Coronary calcium score of 177. This was 55 percentile for age-, race-, and sex-matched controls.   Aortic atherosclerosis  Risk Assessment/Calculations:             Physical Exam:    VS:  BP 112/64 (BP Location: Left Arm, Patient Position: Sitting, Cuff Size: Large)   Pulse 75  Ht 5\' 1"  (1.549 m)   Wt 180 lb (81.6 kg)   BMI 34.01 kg/m  , BMI Body mass index is 34.01 kg/m. GENERAL:  Well appearing HEENT: Pupils equal round and reactive, fundi not visualized, oral mucosa unremarkable NECK:  No jugular venous distention, waveform within normal limits, carotid upstroke brisk  and symmetric, no bruits, no thyromegaly LUNGS:  Clear to auscultation bilaterally HEART:  RRR.  PMI not displaced or sustained,S1 and S2 within normal limits, no S3, no S4, no clicks, no rubs, no murmurs ABD:  Flat, positive bowel sounds normal in frequency in pitch, no bruits, no rebound, no guarding, no midline pulsatile mass, no hepatomegaly, no splenomegaly EXT:  2 plus pulses throughout, no edema, no cyanosis no clubbing SKIN:  No rashes no nodules NEURO:  Cranial nerves II through XII grossly intact, motor grossly intact throughout PSYCH:  Cognitively intact, oriented to person place and time  Wt Readings from Last 3 Encounters:  09/19/22 180 lb (81.6 kg)  05/16/22 163 lb (73.9 kg)  04/11/22 156 lb 6.4 oz (70.9 kg)     ASSESSMENT AND PLAN: .    # Exertional Dyspnea and Chest Discomfort New onset this summer. No orthopnea or peripheral edema. EKG showed low voltage. CT calcium score showed more calcium than expected for age, race, and gender. Given the symptoms and findings, there is a high suspicion for significant coronary artery disease. -Plan for left heart catheterization after 09/23/2022. -Start Aspirin 81mg  daily. -Order LP(a) level. -Continue rosuvastatin  # Hyperlipidemia On Rosuvastatin. No changes to current regimen. -Continue Rosuvastatin.  # Hypertension Well controlled on Lisinopril and Hydrochlorothiazide. -Continue Lisinopril and Hydrochlorothiazide.  General Health Maintenance -Schedule follow-up visit 1-2 months after heart catheterization. -Schedule echocardiogram at patient's convenience.  Informed Consent   Shared Decision Making/Informed Consent The risks [stroke (1 in 1000), death (1 in 1000), kidney failure [usually temporary] (1 in 500), bleeding (1 in 200), allergic reaction [possibly serious] (1 in 200)], benefits (diagnostic support and management of coronary artery disease) and alternatives of a cardiac catheterization were discussed in detail  with Jill Mcintosh and she is willing to proceed.     Dispo:  FU with Casmer Yepiz C. Duke Salvia, MD, [facility] in 1-2 months following heart cath.  I,Mathew Stumpf,acting as a Neurosurgeon for Chilton Si, MD.,have documented all relevant documentation on the behalf of Chilton Si, MD,as directed by  Chilton Si, MD while in the presence of Chilton Si, MD.  I, Jumanah Hynson C. Duke Salvia, MD have reviewed all documentation for this visit.  The documentation of the exam, diagnosis, procedures, and orders on 09/19/2022 are all accurate and complete.   Signed, Chilton Si, MD

## 2022-09-21 LAB — BASIC METABOLIC PANEL
BUN/Creatinine Ratio: 17 (ref 12–28)
BUN: 15 mg/dL (ref 8–27)
CO2: 21 mmol/L (ref 20–29)
Calcium: 10.3 mg/dL (ref 8.7–10.3)
Chloride: 103 mmol/L (ref 96–106)
Creatinine, Ser: 0.86 mg/dL (ref 0.57–1.00)
Glucose: 93 mg/dL (ref 70–99)
Potassium: 4.2 mmol/L (ref 3.5–5.2)
Sodium: 140 mmol/L (ref 134–144)
eGFR: 76 mL/min/{1.73_m2} (ref >59)

## 2022-09-21 LAB — CBC WITH DIFFERENTIAL/PLATELET
Basophils Absolute: 0 10*3/uL (ref 0.0–0.2)
Basos: 1 %
EOS (ABSOLUTE): 0.1 10*3/uL (ref 0.0–0.4)
Eos: 2 %
Hematocrit: 42.3 % (ref 34.0–46.6)
Hemoglobin: 13.9 g/dL (ref 11.1–15.9)
Immature Grans (Abs): 0 10*3/uL (ref 0.0–0.1)
Immature Granulocytes: 0 %
Lymphocytes Absolute: 1.6 10*3/uL (ref 0.7–3.1)
Lymphs: 34 %
MCH: 29.9 pg (ref 26.6–33.0)
MCHC: 32.9 g/dL (ref 31.5–35.7)
MCV: 91 fL (ref 79–97)
Monocytes Absolute: 0.5 10*3/uL (ref 0.1–0.9)
Monocytes: 10 %
Neutrophils Absolute: 2.5 10*3/uL (ref 1.4–7.0)
Neutrophils: 53 %
Platelets: 281 10*3/uL (ref 150–450)
RBC: 4.65 x10E6/uL (ref 3.77–5.28)
RDW: 12.5 % (ref 11.7–15.4)
WBC: 4.7 10*3/uL (ref 3.4–10.8)

## 2022-09-21 LAB — LIPOPROTEIN A (LPA): Lipoprotein (a): 28.5 nmol/L (ref <75.0)

## 2022-09-25 ENCOUNTER — Telehealth: Payer: Self-pay | Admitting: *Deleted

## 2022-09-25 NOTE — Telephone Encounter (Signed)
Cardiac Catheterization scheduled at [facility] for: Thursday September 26, 2022 7:30 AM Arrival time [facility] Main Entrance A at: 5:30 AM  Nothing to eat after midnight prior to procedure, clear liquids until 5 AM day of procedure.  Medication instructions: -Hold:  Metformin-day of procedure and 48 hours post procedure  Lisinopril/HCT-AM of procedure -Other usual morning medications can be taken with sips of water including aspirin 81 mg.  Plan to go home the same day, you will only stay overnight if medically necessary.  You must have responsible adult to drive you home.  Someone must be with you the first 24 hours after you arrive home.  Reviewed procedure instructions with patient.

## 2022-09-26 ENCOUNTER — Encounter (HOSPITAL_COMMUNITY): Payer: Self-pay | Admitting: Interventional Cardiology

## 2022-09-26 ENCOUNTER — Ambulatory Visit (HOSPITAL_COMMUNITY)
Admission: RE | Admit: 2022-09-26 | Discharge: 2022-09-26 | Disposition: A | Discharge status: Home / Self Care | Payer: BC Managed Care – PPO | Attending: Interventional Cardiology | Admitting: Interventional Cardiology

## 2022-09-26 ENCOUNTER — Other Ambulatory Visit: Payer: Self-pay

## 2022-09-26 ENCOUNTER — Encounter (HOSPITAL_COMMUNITY)
Admission: RE | Disposition: A | Discharge status: Home / Self Care | Payer: Self-pay | Source: Home / Self Care | Attending: Interventional Cardiology

## 2022-09-26 DIAGNOSIS — I7300 Raynaud's syndrome without gangrene: Secondary | ICD-10-CM | POA: Diagnosis not present

## 2022-09-26 DIAGNOSIS — E785 Hyperlipidemia, unspecified: Secondary | ICD-10-CM | POA: Insufficient documentation

## 2022-09-26 DIAGNOSIS — Z8249 Family history of ischemic heart disease and other diseases of the circulatory system: Secondary | ICD-10-CM | POA: Insufficient documentation

## 2022-09-26 DIAGNOSIS — Z79899 Other long term (current) drug therapy: Secondary | ICD-10-CM | POA: Diagnosis not present

## 2022-09-26 DIAGNOSIS — M3500 Sicca syndrome, unspecified: Secondary | ICD-10-CM | POA: Insufficient documentation

## 2022-09-26 DIAGNOSIS — M329 Systemic lupus erythematosus, unspecified: Secondary | ICD-10-CM | POA: Diagnosis not present

## 2022-09-26 DIAGNOSIS — I10 Essential (primary) hypertension: Secondary | ICD-10-CM | POA: Insufficient documentation

## 2022-09-26 DIAGNOSIS — R001 Bradycardia, unspecified: Secondary | ICD-10-CM | POA: Insufficient documentation

## 2022-09-26 DIAGNOSIS — I25119 Atherosclerotic heart disease of native coronary artery with unspecified angina pectoris: Secondary | ICD-10-CM | POA: Diagnosis present

## 2022-09-26 DIAGNOSIS — R0609 Other forms of dyspnea: Secondary | ICD-10-CM | POA: Diagnosis not present

## 2022-09-26 DIAGNOSIS — I700 Atherosclerosis of aorta: Secondary | ICD-10-CM | POA: Diagnosis not present

## 2022-09-26 DIAGNOSIS — I2510 Atherosclerotic heart disease of native coronary artery without angina pectoris: Secondary | ICD-10-CM | POA: Diagnosis not present

## 2022-09-26 DIAGNOSIS — E039 Hypothyroidism, unspecified: Secondary | ICD-10-CM | POA: Diagnosis not present

## 2022-09-26 DIAGNOSIS — R079 Chest pain, unspecified: Secondary | ICD-10-CM

## 2022-09-26 HISTORY — PX: LEFT HEART CATH AND CORONARY ANGIOGRAPHY: CATH118249

## 2022-09-26 LAB — GLUCOSE, CAPILLARY
Glucose-Capillary: 101 mg/dL — ABNORMAL HIGH (ref 70–99)
Glucose-Capillary: 116 mg/dL — ABNORMAL HIGH (ref 70–99)

## 2022-09-26 SURGERY — LEFT HEART CATH AND CORONARY ANGIOGRAPHY
Anesthesia: LOCAL

## 2022-09-26 MED ORDER — IOHEXOL 350 MG/ML SOLN
INTRAVENOUS | Status: DC | PRN
  Administered 2022-09-26: 50 mL

## 2022-09-26 MED ORDER — ASPIRIN 81 MG PO CHEW
81.0000 mg | CHEWABLE_TABLET | ORAL | Status: AC
  Administered 2022-09-26: 81 mg via ORAL
  Filled 2022-09-26: qty 1

## 2022-09-26 MED ORDER — HEPARIN SODIUM (PORCINE) 1000 UNIT/ML IJ SOLN
INTRAMUSCULAR | Status: AC
  Filled 2022-09-26: qty 10

## 2022-09-26 MED ORDER — HYDRALAZINE HCL 20 MG/ML IJ SOLN: 10.0000 mg | INTRAMUSCULAR | Status: DC | PRN

## 2022-09-26 MED ORDER — VERAPAMIL HCL 2.5 MG/ML IV SOLN
INTRAVENOUS | Status: AC
  Filled 2022-09-26: qty 2

## 2022-09-26 MED ORDER — SODIUM CHLORIDE 0.9 % IV SOLN: INTRAVENOUS | Status: AC

## 2022-09-26 MED ORDER — MIDAZOLAM HCL 2 MG/2ML IJ SOLN
INTRAMUSCULAR | Status: AC
  Filled 2022-09-26: qty 2

## 2022-09-26 MED ORDER — SODIUM CHLORIDE 0.9 % IV SOLN: 250.0000 mL | INTRAVENOUS | Status: DC | PRN

## 2022-09-26 MED ORDER — VERAPAMIL HCL 2.5 MG/ML IV SOLN
INTRAVENOUS | Status: DC | PRN
  Administered 2022-09-26 (×2): 10 mL via INTRA_ARTERIAL

## 2022-09-26 MED ORDER — ONDANSETRON HCL 4 MG/2ML IJ SOLN: 4.0000 mg | Freq: Four times a day (QID) | INTRAMUSCULAR | Status: DC | PRN

## 2022-09-26 MED ORDER — LIDOCAINE HCL (PF) 1 % IJ SOLN
INTRAMUSCULAR | Status: DC | PRN
  Administered 2022-09-26: 2 mL

## 2022-09-26 MED ORDER — HEPARIN SODIUM (PORCINE) 1000 UNIT/ML IJ SOLN
INTRAMUSCULAR | Status: DC | PRN
  Administered 2022-09-26: 4000 [IU] via INTRAVENOUS

## 2022-09-26 MED ORDER — SODIUM CHLORIDE 0.9% FLUSH: 3.0000 mL | Freq: Two times a day (BID) | INTRAVENOUS | Status: DC

## 2022-09-26 MED ORDER — SODIUM CHLORIDE 0.9 % WEIGHT BASED INFUSION
3.0000 mL/kg/h | INTRAVENOUS | Status: AC
  Administered 2022-09-26: 3 mL/kg/h via INTRAVENOUS

## 2022-09-26 MED ORDER — HEPARIN (PORCINE) IN NACL 1000-0.9 UT/500ML-% IV SOLN
INTRAVENOUS | Status: DC | PRN
  Administered 2022-09-26 (×2): 500 mL

## 2022-09-26 MED ORDER — METFORMIN HCL 500 MG PO TABS: 500.0000 mg | ORAL_TABLET | Freq: Every morning | ORAL | Status: DC

## 2022-09-26 MED ORDER — LIDOCAINE HCL (PF) 1 % IJ SOLN
INTRAMUSCULAR | Status: AC
  Filled 2022-09-26: qty 30

## 2022-09-26 MED ORDER — FENTANYL CITRATE (PF) 100 MCG/2ML IJ SOLN
INTRAMUSCULAR | Status: DC | PRN
  Administered 2022-09-26 (×2): 25 ug via INTRAVENOUS

## 2022-09-26 MED ORDER — MIDAZOLAM HCL 2 MG/2ML IJ SOLN
INTRAMUSCULAR | Status: DC | PRN
  Administered 2022-09-26: 2 mg via INTRAVENOUS
  Administered 2022-09-26: 1 mg via INTRAVENOUS

## 2022-09-26 MED ORDER — LABETALOL HCL 5 MG/ML IV SOLN: 10.0000 mg | INTRAVENOUS | Status: DC | PRN

## 2022-09-26 MED ORDER — SODIUM CHLORIDE 0.9% FLUSH: 3.0000 mL | INTRAVENOUS | Status: DC | PRN

## 2022-09-26 MED ORDER — SODIUM CHLORIDE 0.9 % WEIGHT BASED INFUSION: 1.0000 mL/kg/h | INTRAVENOUS | Status: DC

## 2022-09-26 MED ORDER — FENTANYL CITRATE (PF) 100 MCG/2ML IJ SOLN
INTRAMUSCULAR | Status: AC
  Filled 2022-09-26: qty 2

## 2022-09-26 MED ORDER — ACETAMINOPHEN 325 MG PO TABS: 650.0000 mg | ORAL_TABLET | ORAL | Status: DC | PRN

## 2022-09-26 SURGICAL SUPPLY — 7 items
CATH 5FR JL3.5 JR4 ANG PIG MP (CATHETERS) IMPLANT
GLIDESHEATH SLEND SS 6F .021 (SHEATH) IMPLANT
GUIDEWIRE INQWIRE 1.5J.035X260 (WIRE) IMPLANT
INQWIRE 1.5J .035X260CM (WIRE) ×1
PACK CARDIAC CATHETERIZATION (CUSTOM PROCEDURE TRAY) ×2 IMPLANT
SET ATX-X65L (MISCELLANEOUS) IMPLANT
SHEATH PROBE COVER 6X72 (BAG) IMPLANT

## 2022-09-26 NOTE — Interval H&P Note (Signed)
Cath Lab Visit (complete for each Cath Lab visit)  Clinical Evaluation Leading to the Procedure:   ACS: No.  Non-ACS:    Anginal Classification: CCS III  Anti-ischemic medical therapy: Minimal Therapy (1 class of medications)  Non-Invasive Test Results: Intermediate-risk stress test findings: cardiac mortality 1-3%/year  Prior CABG: No previous CABG      History and Physical Interval Note:  09/26/2022 7:41 AM  Jill Mcintosh  has presented today for surgery, with the diagnosis of angina.  The various methods of treatment have been discussed with the patient and family. After consideration of risks, benefits and other options for treatment, the patient has consented to  Procedure(s): LEFT HEART CATH AND CORONARY ANGIOGRAPHY (N/A) as a surgical intervention.  The patient's history has been reviewed, patient examined, no change in status, stable for surgery.  I have reviewed the patient's chart and labs.  Questions were answered to the patient's satisfaction.     Lance Muss

## 2022-09-26 NOTE — Discharge Instructions (Signed)

## 2022-09-26 NOTE — Progress Notes (Signed)
TR BAND REMOVAL  LOCATION:    right radial  DEFLATED PER PROTOCOL:    Yes.    TIME BAND OFF / DRESSING APPLIED:    0955 gauze dressing applied   SITE UPON ARRIVAL:    Level 0  SITE AFTER BAND REMOVAL:    Level 0  CIRCULATION SENSATION AND MOVEMENT:    Within Normal Limits   Yes.    COMMENTS:   no issues noted

## 2022-10-14 ENCOUNTER — Encounter (HOSPITAL_BASED_OUTPATIENT_CLINIC_OR_DEPARTMENT_OTHER): Payer: Self-pay

## 2022-10-14 NOTE — Telephone Encounter (Signed)
Opened in error

## 2022-10-28 ENCOUNTER — Ambulatory Visit (HOSPITAL_BASED_OUTPATIENT_CLINIC_OR_DEPARTMENT_OTHER): Payer: BC Managed Care – PPO | Admitting: Family

## 2023-02-26 IMAGING — MG MM DIGITAL SCREENING BILAT W/ TOMO AND CAD
6 of 10 series · 6 of 30 positions shown · non-contrast
Comparison: Previous exam(s).

CLINICAL DATA: Screening.

EXAM:
DIGITAL SCREENING BILATERAL MAMMOGRAM WITH TOMOSYNTHESIS AND CAD
TECHNIQUE: Bilateral screening digital craniocaudal and mediolateral oblique
mammograms were obtained. Bilateral screening digital breast
tomosynthesis was performed. The images were evaluated with
computer-aided detection.

[R CC synth-2D]
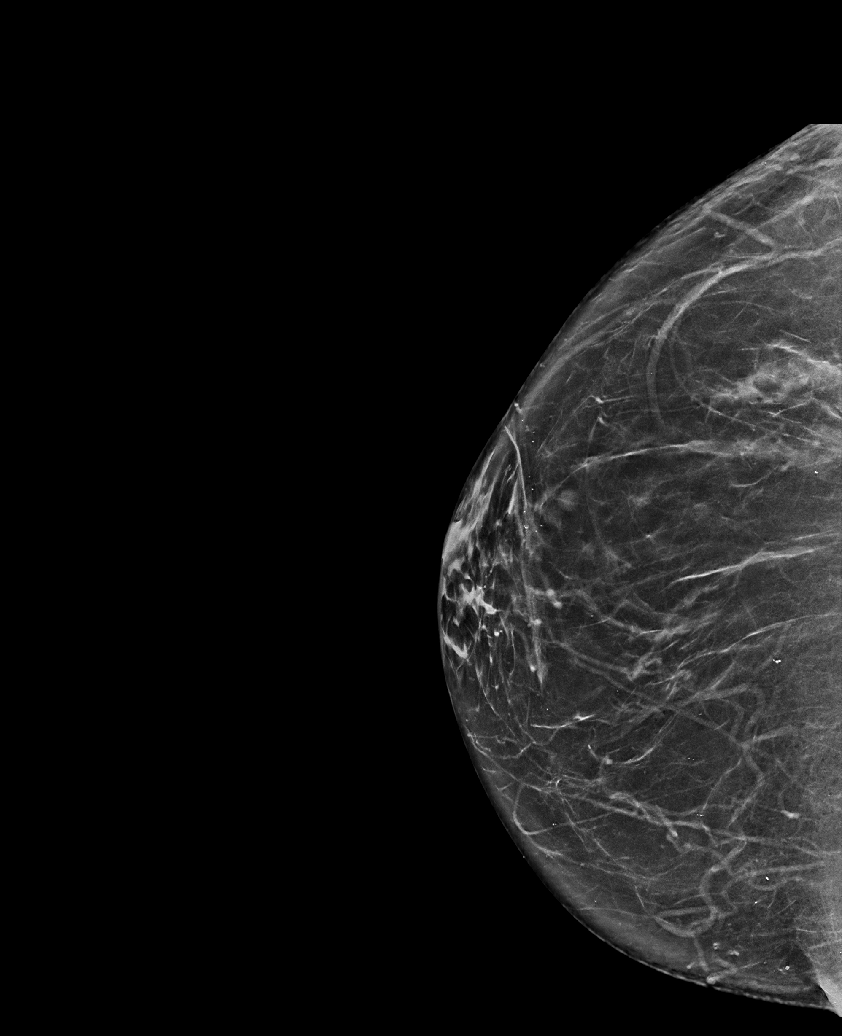

[L CC synth-2D (1 of 2)]
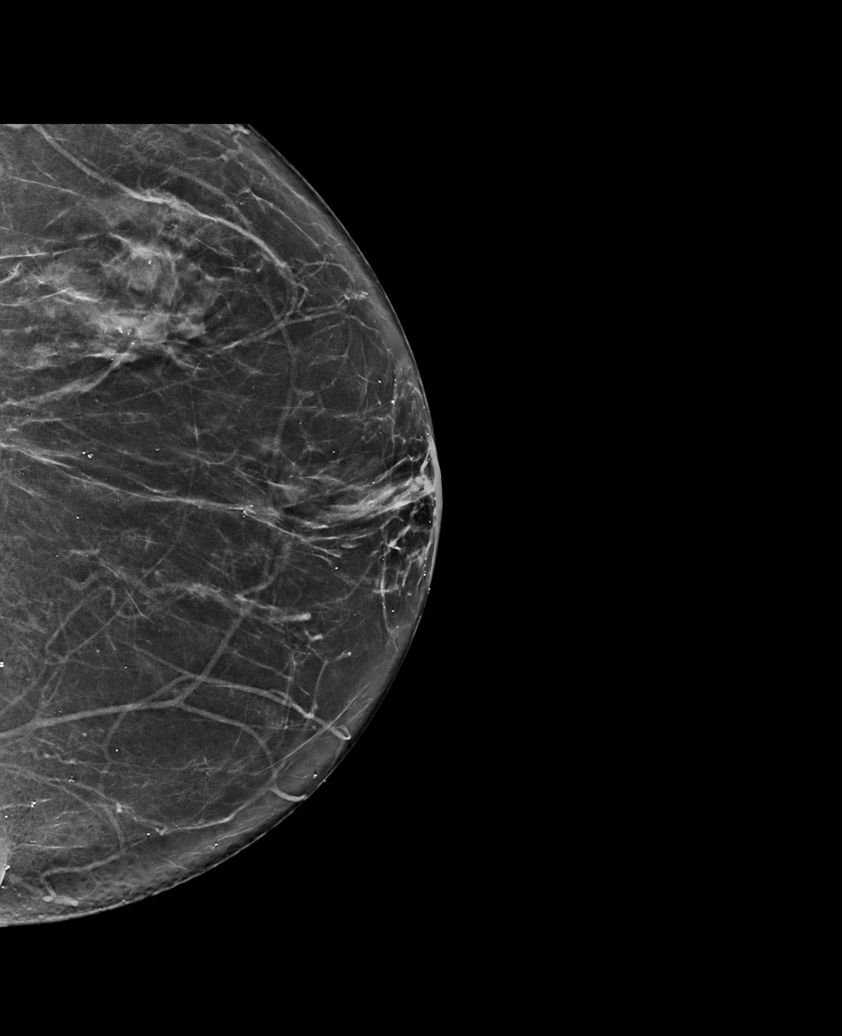

[L CC synth-2D (2 of 2)]
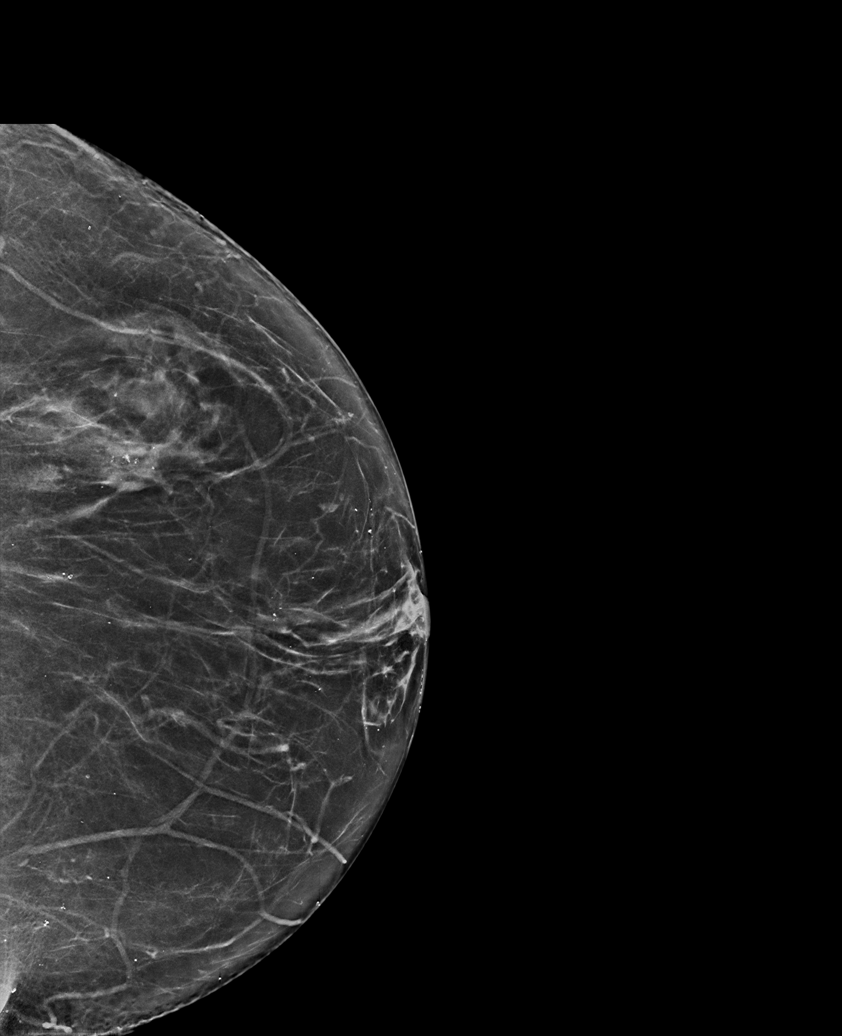

[L MLO synth-2D]
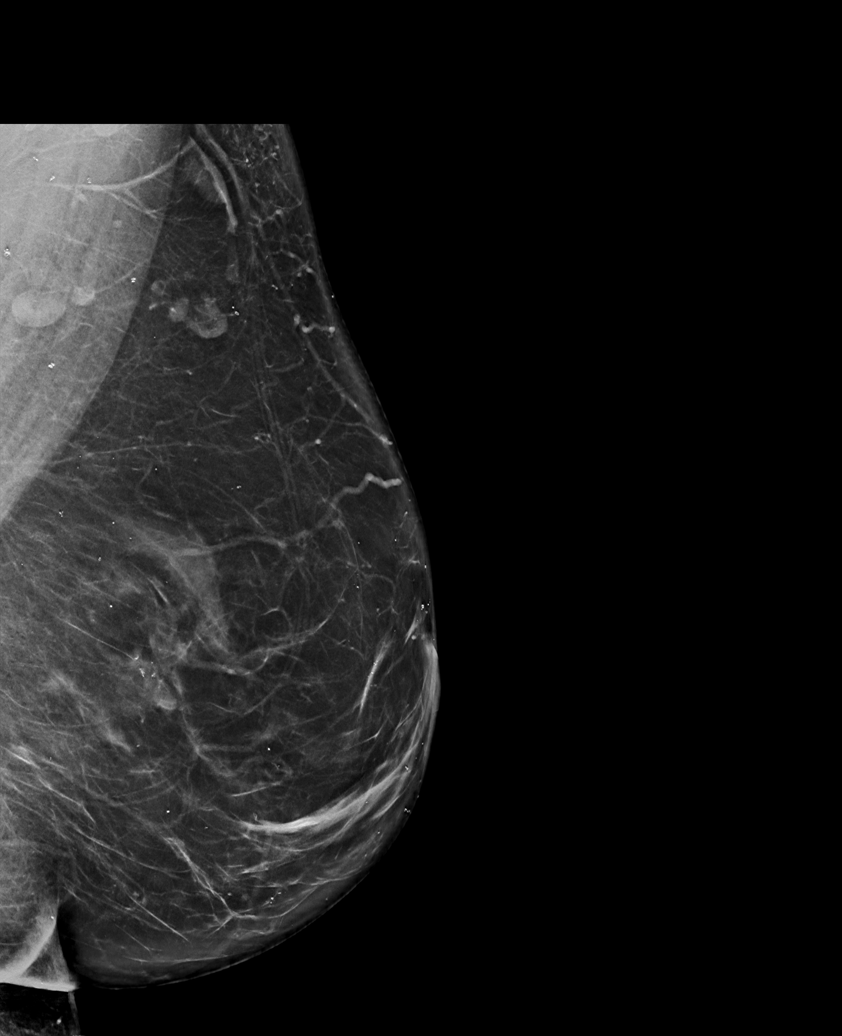

[R MLO synth-2D]
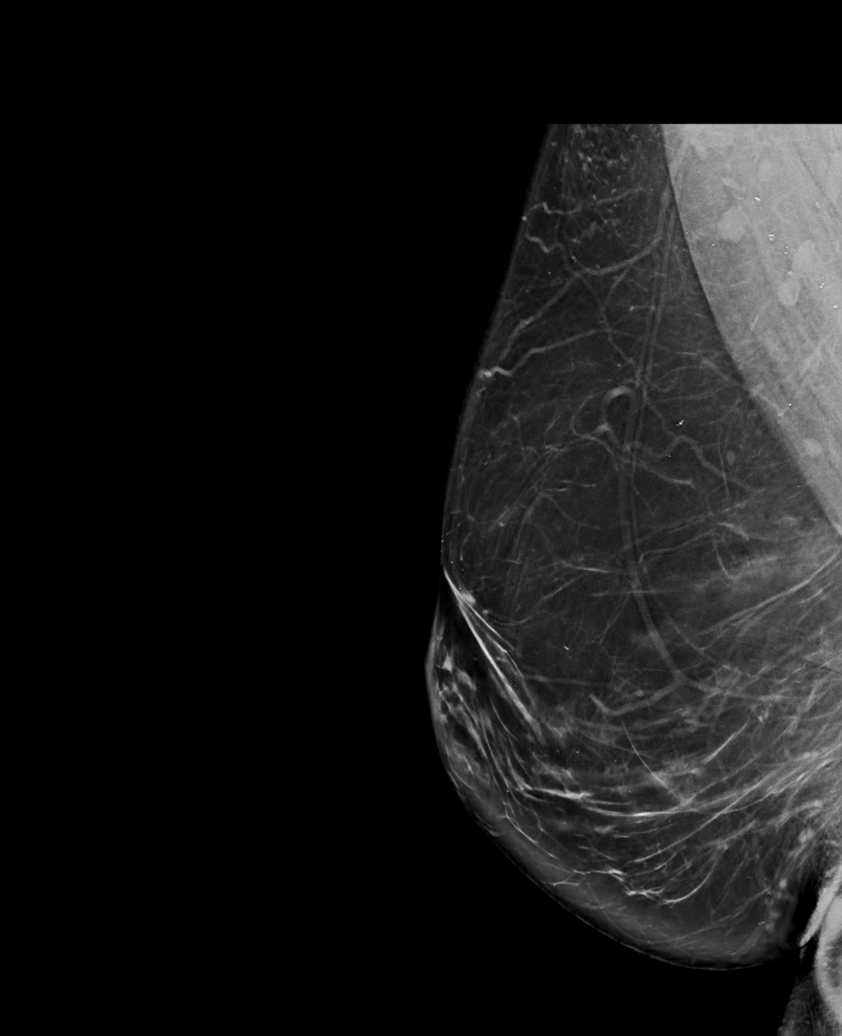

[L MLO tomo · tomo slice 45/88.0]
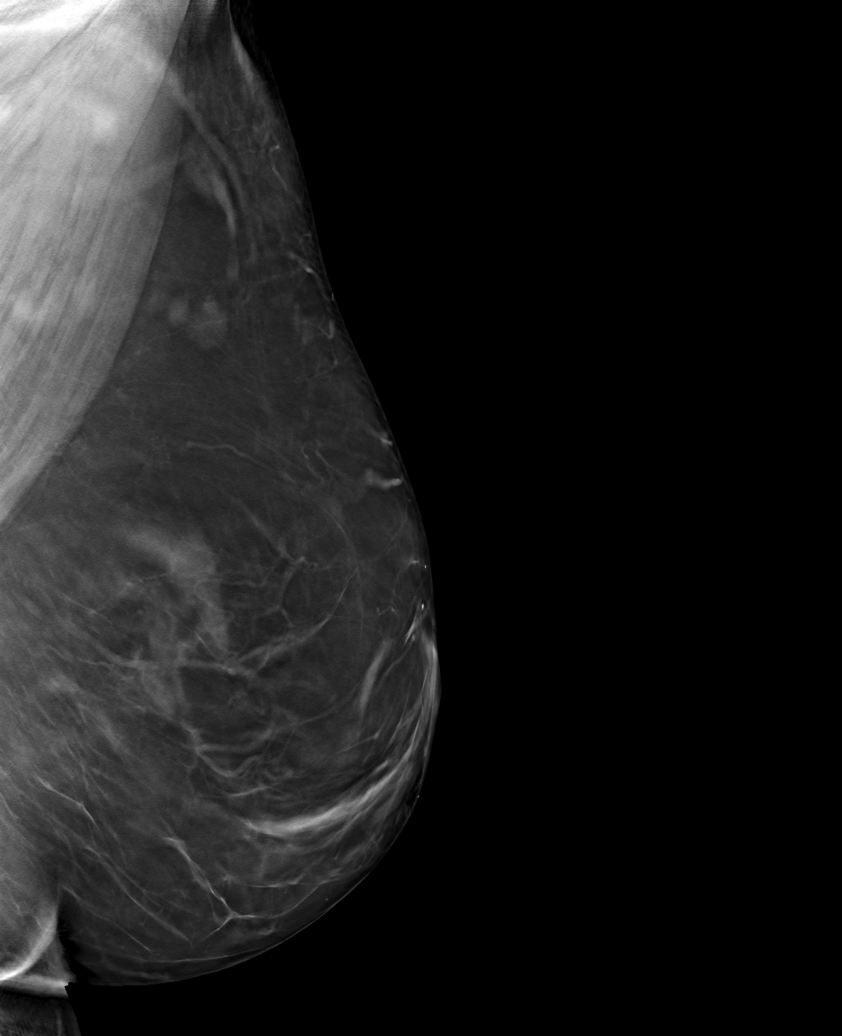

[6 of 30 positions shown; findings below may reference images not displayed]

ACR Breast Density Category b: There are scattered areas of
fibroglandular density.
FINDINGS: There are no findings suspicious for malignancy.
IMPRESSION: No mammographic evidence of malignancy. A result letter of this
screening mammogram will be mailed directly to the patient.

RECOMMENDATION:
Screening mammogram in one year. (Code:51-O-LD2)

BI-RADS CATEGORY  1: Negative.

## 2023-07-17 ENCOUNTER — Other Ambulatory Visit: Payer: Self-pay | Admitting: Family Medicine

## 2023-07-17 DIAGNOSIS — R911 Solitary pulmonary nodule: Secondary | ICD-10-CM

## 2023-07-28 ENCOUNTER — Ambulatory Visit
Admission: RE | Admit: 2023-07-28 | Discharge: 2023-07-28 | Disposition: A | Discharge status: Home / Self Care | Payer: Self-pay | Source: Ambulatory Visit | Attending: Family Medicine | Admitting: Family Medicine

## 2023-07-28 DIAGNOSIS — R911 Solitary pulmonary nodule: Secondary | ICD-10-CM

## 2023-08-14 ENCOUNTER — Encounter (HOSPITAL_BASED_OUTPATIENT_CLINIC_OR_DEPARTMENT_OTHER): Payer: Self-pay | Admitting: Pulmonary Disease

## 2023-08-14 ENCOUNTER — Ambulatory Visit (HOSPITAL_BASED_OUTPATIENT_CLINIC_OR_DEPARTMENT_OTHER): Admitting: Pulmonary Disease

## 2023-08-14 ENCOUNTER — Telehealth: Payer: Self-pay | Admitting: Pulmonary Disease

## 2023-08-14 VITALS — BP 148/74 | HR 66 | Ht 61.0 in | Wt 155.0 lb

## 2023-08-14 DIAGNOSIS — R918 Other nonspecific abnormal finding of lung field: Secondary | ICD-10-CM | POA: Insufficient documentation

## 2023-08-14 DIAGNOSIS — J842 Lymphoid interstitial pneumonia: Secondary | ICD-10-CM | POA: Diagnosis not present

## 2023-08-14 NOTE — Telephone Encounter (Signed)
 Please schedule the following:  Provider performing procedure: Byrum Diagnosis: multiple lung nodules, RLL enlarging Which side if for nodule / mass? bilateral Procedure: Ion Robot Bronch Navigation  Has patient been spoken to by Provider and given informed consent? Yes Anesthesia: General Do you need Fluro? Yes, cone beam Duration of procedure: 1.5 hours Date: 08/25/2023 Alternate Date: 08/26/2023  Time: any Location: [facility] Does patient have OSA? No DM? No Or Latex allergy? No Medication Restriction/ Anticoagulate/Antiplatelet: Baby aspirin  Pre-op Labs Ordered:determined by Anesthesia Imaging request: ordered super D and PET/CT  (If, SuperDimension CT Chest, please have STAT courier sent to ENDO)   Patient on baby aspirin , please hold 5 days prior to procedure.  PET and super D ordered. Please schedule within a week.

## 2023-08-14 NOTE — Progress Notes (Signed)
 New Patient Pulmonology Office Visit   Subjective:  Patient ID: Jill Mcintosh, female    DOB: 1960-09-18  MRN: [id number]  Referred by: Claudene Pellet, MD  CC:  Chief Complaint  Patient presents with   Establish Care    HPI Jill Mcintosh is a 63 y.o. female with HTN, HLD, non-obstructive CAD, hypothyroidism, systemic lupus erythematosus, Sjogren's with Raynaud's syndrome, who presents for initial evaluation in the setting of multiple pulmonary nodules.  Lung nodule popped up on CT coronaries 07/2022. Repeat CT chest 07/2023. PCP asked her to get the pulmonary nodules checked out.  No respiratory symptoms. Sometimes has mucous in her lungs. Has some phlegm sometimes. Usually in the AM. Clear phlegm. No hemoptysis. No chest discomfort or wheezing. Burning sensation in her chest intermittently but not too bad. She thinks this is related to GERD. Self resolves. Randomly happens. Not after eating. She had pneumonia in the past including RSV. She was at home.   Symptoms associated with Lung cancer:   - Sx of Central Tumors:  [ ]  Dyspnea  [X]  Cough  [ ]  Hemoptysis  [ ]  Wheezing  [ ]  Post-obstructive pneumonia   - Sx of Peripheral Tumors:  [ ]  Cough  [ ]  Dyspnea  [ ]  Pain   - Sx of locoregional spread:  [ ]  Superior vena cava obstruction - facial flushing/redness, upper extremity edema, chest pain, cyanosis  [ ]  Sx of recurrent laryngeal nerve palsy - hoarseness  [ ]  Sx of phrenic nerve palsy - elevated hemidiaphragm and dyspnea  [ ]  Brachial nerve root compression - Horner's syndrome miosis, ptosis, anhidrosis  [ ]  Esophageal compression - dysphagia, nausea, vomiting Constitutional symptoms:  [X]  Weight loss - 25 lb weight loss, patient intentionally doing that. [ ]  Changes in appetite  [ ]  B symptoms - fevers, chills, or night sweats Sx of metastasis:  [ ]  CNS - headaches, AMS, neck stiffness, seizures, nausea/vomiting, poor balance  [ ]  MSK - bone pain, limb  weakness or sensory loss   Conditions associated with Lung cancer & important to identify prior to bronch:  [ ]  Hx of COPD  [X]  Hx of CVD  [ ]  Hx of OSA   Hx of Anesthesia reactions: appendix and gall bladder surgery, breast reduction [ ]  Prior surgeries and anesthesia reactions - NONE [ ]  Hx of difficult intubation  [ ]  Family Hx of malignant hyperthermia   PMH:  - Sjogren's Syndrome - Lupus  Medications:  - Baby Aspirin  - Hydroxychloroquine   Allergies: NONE  SH:  [ ]  Tobacco Smoking - NEVER [ ]  Second hand smoking  [X]  Indoor emission from household combustion - grew up with wood heater, not well ventilated, 15 year exposure [ ]  Outdoor air pollution   Occupational exposures: retired, [employer] [X]  Asbestos - Holiday representative workers, Social research officer, government, Therapist, sports, railroad, Public relations account executive radiation - uranium mining  [ ]  Vinyl chloride - pulp & paper workers  [ ]  Arsenic - smelting of ores, Chief Executive Officer, wood preservation  [ ]  Beryllium - Archivist workers, Geophysical data processor, Pensions consultant workers, jewelers  [ ]  chromium - Financial trader, welding, tanning industries  [ ]  Nickel - Chief Strategy Officer, Naval architect, electroplaters, glass wokers, Health and safety inspector, metal workers & welders, Physiological scientist specific exposures:  [ ]  Agent orange - [place] War  [ ]  Depleted Uranium - Gulf War  [ ]  Asbestos - Cabin crew due to shipyards  [ ]  Mustard gas - WWI  FH:  [X]  Relatives with hx of lung cancer - maternal uncle with NSCLC (prior smoker) [X]  Relatives with other types of cancer - maternal aunt + paternal aunt with breast cancer    ASA grade:  [ ]  ASA I - normal healthy patient  [ ]  ASA II - a patient with mild systemic disease  [X]  ASA III - a patient with severe systemic disease  [ ]  ASA IV - a patient with severe systemic disease that is a constant threat to Life   Karnofsky Performance Status: [ ]  100 Normal; no complaints;  [X]  90  Able to carry on normal activity, minor signs,Tor symptoms of disease  [ ]  80 Normal activity with effort; some sign or symptoms of disease  [ ]  70 Cares for self; unable to carry on normal activity or do active work  [ ]  60 Requires occasional assistance, but is able to care for most personal needs  [ ]  50 Requires considerable assistance and frequent medical care  [ ]  40 Disabled, requires special care and assistance  [ ]  30 Severely disabled; hospitalization is indicated, although death not imminent  [ ]  20 Very sick; hospitalization necessary; requires active support treatment  [ ]  10 Moribund; fatal processes progressing rapidly  [ ]  0 Dead   ECOG PERFORMANCE STATUS: [X]  0 Fully Active, able to carry on all pre-disease performance without restriction  [ ]  1 Restricted in physical strenuous activity but ambulatory and able to carry out work of a light or sedentary nature e.g. light house work office work  [ ]  2 Ambulatory and capable of all self-care but unable to carry our work activities. Up and about more than 50% of walking hours  [ ]  3 Capable of only limited self-care, confined to bed or chair more than 50% of waking hours  [ ]  4 Completely disable, Cannot carry on any self-care. Totally confined to bed or chair  [ ]  5 Dead  ROS  Allergies: Darvon [propoxyphene]  Current Outpatient Medications:    acetaminophen  (TYLENOL ) 500 MG tablet, Take 2 tablets (1,000 mg total) by mouth every 8 (eight) hours as needed for mild pain or fever., Disp: , Rfl:    albuterol  (VENTOLIN  HFA) 108 (90 Base) MCG/ACT inhaler, Inhale 1-2 puffs into the lungs every 6 (six) hours as needed for wheezing or shortness of breath., Disp: , Rfl:    aspirin  EC 81 MG tablet, Take 81 mg by mouth in the morning. Swallow whole., Disp: , Rfl:    celecoxib (CELEBREX) 200 MG capsule, Take 200 mg by mouth in the morning., Disp: , Rfl:    cetirizine (ZYRTEC) 10 MG tablet, Take 10 mg by mouth at bedtime., Disp: , Rfl:     Cyanocobalamin (VITAMIN B-12 PO), Take by mouth., Disp: , Rfl:    ferrous sulfate 325 (65 FE) MG tablet, Take 325 mg by mouth in the morning., Disp: , Rfl:    fluticasone (FLONASE) 50 MCG/ACT nasal spray, Place 1 spray into both nostrils daily as needed for allergies or rhinitis., Disp: , Rfl:    hydroxychloroquine  (PLAQUENIL ) 200 MG tablet, Take 400 mg by mouth in the morning., Disp: , Rfl:    levothyroxine  (SYNTHROID ) 125 MCG tablet, Take 112 mcg by mouth daily before breakfast., Disp: , Rfl:    LINZESS 145 MCG CAPS capsule, Take 145 mcg by mouth daily as needed (constipation.)., Disp: , Rfl:    lisinopril -hydrochlorothiazide  (ZESTORETIC ) 20-12.5 MG tablet, Take 1 tablet by mouth in the morning.,  Disp: , Rfl:    Multiple Vitamin (MULTIVITAMIN WITH MINERALS) TABS tablet, Take 1 tablet by mouth in the morning., Disp: , Rfl:    naphazoline-pheniramine (VISINE) 0.025-0.3 % ophthalmic solution, Place 1-2 drops into both eyes 2 (two) times daily as needed (irritated dry/eyes)., Disp: , Rfl:    phentermine (ADIPEX-P) 37.5 MG tablet, Take 37.5 mg by mouth daily before breakfast., Disp: , Rfl:    pramipexole (MIRAPEX) 1 MG tablet, Take 1 mg by mouth at bedtime., Disp: , Rfl:    rosuvastatin (CRESTOR) 20 MG tablet, Take 20 mg by mouth at bedtime., Disp: , Rfl:    zolpidem (AMBIEN) 10 MG tablet, Take 5-10 mg by mouth at bedtime., Disp: , Rfl:  Past Medical History:  Diagnosis Date   Allergic rhinitis    Angina pectoris (HCC) 09/19/2022   Anxiety    B12 deficiency    Bradycardia    CAD in native artery 09/19/2022   Dry eyes and mouth    Elevated cholesterol    Fatty liver    Gallbladder problem    H/O bilateral breast reduction surgery    Heartburn    Hypertension    Insomnia    Joint pain    Lupus    Mild tricuspid regurgitation    Raynaud's syndrome    Restless leg syndrome    Seasonal allergies    Sicca syndrome (HCC)    Sjogren's disease (HCC)    SOB (shortness of breath)    Thyroid  disease    Vitamin D  deficiency    Past Surgical History:  Procedure Laterality Date   ANUS SURGERY  08/2018   Anal Gland   BREAST REDUCTION SURGERY     CHOLECYSTECTOMY     LAPAROSCOPIC APPENDECTOMY N/A 01/09/2020   Procedure: APPENDECTOMY LAPAROSCOPIC;  Surgeon: Teresa Lonni HERO, MD;  Location: [facility];  Service: General;  Laterality: N/A;   LEFT HEART CATH AND CORONARY ANGIOGRAPHY N/A 09/26/2022   Procedure: LEFT HEART CATH AND CORONARY ANGIOGRAPHY;  Surgeon: Dann Candyce RAMAN, MD;  Location: [facility];  Service: Cardiovascular;  Laterality: N/A;   NASAL SINUS SURGERY     REDUCTION MAMMAPLASTY Bilateral    TONSILLECTOMY     Family History  Problem Relation Age of Onset   Thyroid disease Mother    Hyperlipidemia Mother    Hypertension Mother    Heart disease Father    Diabetes Father    Heart attack Father    Hyperlipidemia Father    Hypertension Father    Obesity Father    Hypertension Brother    Diabetes Brother    Heart disease Brother    Coronary artery disease Brother    Hypertension Brother    Hypertension Paternal Grandmother    Diabetes Paternal Grandmother    Heart attack Paternal Grandfather    Social History   Socioeconomic History   Marital status: Married    Spouse name: jerry   Number of children: 1   Years of education: college   Highest education level: Not on file  Occupational History   Occupation: [employer]   Occupation: Part Time/ Retired  Tobacco Use   Smoking status: Never   Smokeless tobacco: Never  Substance and Sexual Activity   Alcohol use: Yes    Alcohol/week: 0.0 standard drinks of alcohol    Comment: rarely   Drug use: No   Sexual activity: Not on file  Other Topics Concern   Not on file  Social History Narrative  Not on file   Social Drivers of Health   Financial Resource Strain: Not on file  Food Insecurity: Not on file  Transportation Needs: Not on file  Physical Activity: Not on file   Stress: Not on file  Social Connections: Unknown (05/18/2021)   [facility]   Social Network    Social Network: Not on file  Intimate Partner Violence: Unknown (04/09/2021)   Received from [facility]   HITS    Physically Hurt: Not on file    Insult or Talk Down To: Not on file    Threaten Physical Harm: Not on file    Scream or Curse: Not on file       Objective:  BP (!) 148/74 (BP Location: Left Arm, Patient Position: Sitting)   Pulse 66   Ht 5' 1 (1.549 m)   Wt 155 lb (70.3 kg)   SpO2 98%   BMI 29.29 kg/m  SpO2 Readings from Last 3 Encounters:  08/14/23 98%  09/26/22 96%  05/16/22 96%    Physical Exam General: NAD, alert, WD, WN Eyes: PERRL, no scleral icterus ENMT: oropharynx clear, good dentition, no oral lesions, mallampati score IV Skin: warm, intact, no rashes Neck: JVD flat, no adenopathy CV: RRR, no MRG, nl S1 and S2, no peripheral edema Resp: clear to auscultation bilaterally, no wheezes, rales, or rhonchi, normal effort, no clubbing/cyanosis Abdom: Normoactive bowel sounds, soft, nontender, nondistended, no hepatosplenomegaly Ext: edema Neuro: Awake alert oriented to person place time and situation   Diagnostic Review:  Last CBC Lab Results  Component Value Date   WBC 4.7 09/19/2022   HGB 13.9 09/19/2022   HCT 42.3 09/19/2022   MCV 91 09/19/2022   MCH 29.9 09/19/2022   RDW 12.5 09/19/2022   PLT 281 09/19/2022   Last metabolic panel Lab Results  Component Value Date   GLUCOSE 93 09/19/2022   NA 140 09/19/2022   K 4.2 09/19/2022   CL 103 09/19/2022   CO2 21 09/19/2022   BUN 15 09/19/2022   CREATININE 0.86 09/19/2022   EGFR 76 09/19/2022   CALCIUM 10.3 09/19/2022   PROT 7.7 11/21/2021   ALBUMIN 4.4 04/17/2022   LABGLOB 3.0 11/21/2021   AGRATIO 1.6 11/21/2021   BILITOT 0.4 11/21/2021   ALKPHOS 63 11/21/2021   AST 24 04/17/2022   ALT 30 04/17/2022   ANIONGAP 11 01/09/2020   I reviewed the patient's CT coronaries  performed 07/2022 that showed a spiculated RLL pulmonary nodule measuring around 9 x 7 mm. Repeat CT chest on 07/2023 shows evidence of enlargement of RLL solitary pulmonary nodule now measuring 14 x 10 mm. There are also bilateral upper lobe spiculated pulmonary nodules, one subpleural on RUL measuring 10 x 7 mm. The other is in LUL measuring 7 x 6 mm. She has multiple cysts in her lungs that are scattered and have variable sizes. LIP pattern.    Assessment & Plan:   Assessment & Plan Multiple lung nodules The patient has multiple bilateral pulmonary nodules. The most concerning and largest of which is a RLL pulmonary nodule that was initially identified on CT coronaries in July 2024 and has increased in size on most recent CT chest 07/2023 now measuring 14 x 10 mm. The patient has hx of lupus and is on immunosuppression with hydroxychloroquine  so differential diagnosis includes indolent infections such as fungal and mycobacterial infections. The patient is a non-smoker and so adenocarcinoma is on the list as well. The patient will need to undergo  PET/CT and super D CT. After that, she will need to get navigational bronchoscopy with Dr. Shelah. I discussed the risks/benefits of procedure in great detail with patient and she agreed to pursue the plan above. I will see her in a month to discuss results of biopsy. LIP (lymphoid interstitial pneumonitis) (HCC) The patient has hx of Sjogren's syndrome and has multiple cysts of different sizes. This is likely LIP. Will obtain PFTs and monitor her lung function serially. At this time, there's no need for intervention.   Orders Placed This Encounter  Procedures   Procedural/ Surgical Case Request: VIDEO BRONCHOSCOPY WITH ENDOBRONCHIAL NAVIGATION   NM PET SUPER D CT   NM PET Image Initial (PI) Skull Base To Thigh (F-18 FDG)   CBC with Differential/Platelet   Protime-INR   Basic metabolic panel with GFR   Pulmonary Function Test   I spent 75 minutes reviewing  patient's chart including prior consultant notes, imaging, and PFTs as well as face-to-face with the patient, over half in discussion of the diagnosis and the importance of compliance with the treatment plan.  Return in about 1 month (around 09/14/2023).   Favour Aleshire, MD            Britain Saber, MD

## 2023-08-14 NOTE — Telephone Encounter (Addendum)
 Scheduled with [facility] for 8/18 at 11:15 AM, check in by 8:45 AM. Hold baby Asprin 5 days prior. Case# [id number].  Message sent for combo case to be scheduled. No PA required for PET or CT.  Letter prepared.  I will provide to pt once PET/CT has been scheduled.  Sending to [employer] for auth on bronch.

## 2023-08-14 NOTE — H&P (View-Only) (Signed)
 New Patient Pulmonology Office Visit   Subjective:  Patient ID: Jill Mcintosh, female    DOB: 1960-09-18  MRN: [id number]  Referred by: Claudene Pellet, MD  CC:  Chief Complaint  Patient presents with   Establish Care    HPI Jill Mcintosh is a 63 y.o. female with HTN, HLD, non-obstructive CAD, hypothyroidism, systemic lupus erythematosus, Sjogren's with Raynaud's syndrome, who presents for initial evaluation in the setting of multiple pulmonary nodules.  Lung nodule popped up on CT coronaries 07/2022. Repeat CT chest 07/2023. PCP asked her to get the pulmonary nodules checked out.  No respiratory symptoms. Sometimes has mucous in her lungs. Has some phlegm sometimes. Usually in the AM. Clear phlegm. No hemoptysis. No chest discomfort or wheezing. Burning sensation in her chest intermittently but not too bad. She thinks this is related to GERD. Self resolves. Randomly happens. Not after eating. She had pneumonia in the past including RSV. She was at home.   Symptoms associated with Lung cancer:   - Sx of Central Tumors:  [ ]  Dyspnea  [X]  Cough  [ ]  Hemoptysis  [ ]  Wheezing  [ ]  Post-obstructive pneumonia   - Sx of Peripheral Tumors:  [ ]  Cough  [ ]  Dyspnea  [ ]  Pain   - Sx of locoregional spread:  [ ]  Superior vena cava obstruction - facial flushing/redness, upper extremity edema, chest pain, cyanosis  [ ]  Sx of recurrent laryngeal nerve palsy - hoarseness  [ ]  Sx of phrenic nerve palsy - elevated hemidiaphragm and dyspnea  [ ]  Brachial nerve root compression - Horner's syndrome miosis, ptosis, anhidrosis  [ ]  Esophageal compression - dysphagia, nausea, vomiting Constitutional symptoms:  [X]  Weight loss - 25 lb weight loss, patient intentionally doing that. [ ]  Changes in appetite  [ ]  B symptoms - fevers, chills, or night sweats Sx of metastasis:  [ ]  CNS - headaches, AMS, neck stiffness, seizures, nausea/vomiting, poor balance  [ ]  MSK - bone pain, limb  weakness or sensory loss   Conditions associated with Lung cancer & important to identify prior to bronch:  [ ]  Hx of COPD  [X]  Hx of CVD  [ ]  Hx of OSA   Hx of Anesthesia reactions: appendix and gall bladder surgery, breast reduction [ ]  Prior surgeries and anesthesia reactions - NONE [ ]  Hx of difficult intubation  [ ]  Family Hx of malignant hyperthermia   PMH:  - Sjogren's Syndrome - Lupus  Medications:  - Baby Aspirin  - Hydroxychloroquine   Allergies: NONE  SH:  [ ]  Tobacco Smoking - NEVER [ ]  Second hand smoking  [X]  Indoor emission from household combustion - grew up with wood heater, not well ventilated, 15 year exposure [ ]  Outdoor air pollution   Occupational exposures: retired, [employer] [X]  Asbestos - Holiday representative workers, Social research officer, government, Therapist, sports, railroad, Public relations account executive radiation - uranium mining  [ ]  Vinyl chloride - pulp & paper workers  [ ]  Arsenic - smelting of ores, Chief Executive Officer, wood preservation  [ ]  Beryllium - Archivist workers, Geophysical data processor, Pensions consultant workers, jewelers  [ ]  chromium - Financial trader, welding, tanning industries  [ ]  Nickel - Chief Strategy Officer, Naval architect, electroplaters, glass wokers, Health and safety inspector, metal workers & welders, Physiological scientist specific exposures:  [ ]  Agent orange - [place] War  [ ]  Depleted Uranium - Gulf War  [ ]  Asbestos - Cabin crew due to shipyards  [ ]  Mustard gas - WWI  FH:  [X]  Relatives with hx of lung cancer - maternal uncle with NSCLC (prior smoker) [X]  Relatives with other types of cancer - maternal aunt + paternal aunt with breast cancer    ASA grade:  [ ]  ASA I - normal healthy patient  [ ]  ASA II - a patient with mild systemic disease  [X]  ASA III - a patient with severe systemic disease  [ ]  ASA IV - a patient with severe systemic disease that is a constant threat to Life   Karnofsky Performance Status: [ ]  100 Normal; no complaints;  [X]  90  Able to carry on normal activity, minor signs,Tor symptoms of disease  [ ]  80 Normal activity with effort; some sign or symptoms of disease  [ ]  70 Cares for self; unable to carry on normal activity or do active work  [ ]  60 Requires occasional assistance, but is able to care for most personal needs  [ ]  50 Requires considerable assistance and frequent medical care  [ ]  40 Disabled, requires special care and assistance  [ ]  30 Severely disabled; hospitalization is indicated, although death not imminent  [ ]  20 Very sick; hospitalization necessary; requires active support treatment  [ ]  10 Moribund; fatal processes progressing rapidly  [ ]  0 Dead   ECOG PERFORMANCE STATUS: [X]  0 Fully Active, able to carry on all pre-disease performance without restriction  [ ]  1 Restricted in physical strenuous activity but ambulatory and able to carry out work of a light or sedentary nature e.g. light house work office work  [ ]  2 Ambulatory and capable of all self-care but unable to carry our work activities. Up and about more than 50% of walking hours  [ ]  3 Capable of only limited self-care, confined to bed or chair more than 50% of waking hours  [ ]  4 Completely disable, Cannot carry on any self-care. Totally confined to bed or chair  [ ]  5 Dead  ROS  Allergies: Darvon [propoxyphene]  Current Outpatient Medications:    acetaminophen  (TYLENOL ) 500 MG tablet, Take 2 tablets (1,000 mg total) by mouth every 8 (eight) hours as needed for mild pain or fever., Disp: , Rfl:    albuterol  (VENTOLIN  HFA) 108 (90 Base) MCG/ACT inhaler, Inhale 1-2 puffs into the lungs every 6 (six) hours as needed for wheezing or shortness of breath., Disp: , Rfl:    aspirin  EC 81 MG tablet, Take 81 mg by mouth in the morning. Swallow whole., Disp: , Rfl:    celecoxib (CELEBREX) 200 MG capsule, Take 200 mg by mouth in the morning., Disp: , Rfl:    cetirizine (ZYRTEC) 10 MG tablet, Take 10 mg by mouth at bedtime., Disp: , Rfl:     Cyanocobalamin (VITAMIN B-12 PO), Take by mouth., Disp: , Rfl:    ferrous sulfate 325 (65 FE) MG tablet, Take 325 mg by mouth in the morning., Disp: , Rfl:    fluticasone (FLONASE) 50 MCG/ACT nasal spray, Place 1 spray into both nostrils daily as needed for allergies or rhinitis., Disp: , Rfl:    hydroxychloroquine  (PLAQUENIL ) 200 MG tablet, Take 400 mg by mouth in the morning., Disp: , Rfl:    levothyroxine  (SYNTHROID ) 125 MCG tablet, Take 112 mcg by mouth daily before breakfast., Disp: , Rfl:    LINZESS 145 MCG CAPS capsule, Take 145 mcg by mouth daily as needed (constipation.)., Disp: , Rfl:    lisinopril -hydrochlorothiazide  (ZESTORETIC ) 20-12.5 MG tablet, Take 1 tablet by mouth in the morning.,  Disp: , Rfl:    Multiple Vitamin (MULTIVITAMIN WITH MINERALS) TABS tablet, Take 1 tablet by mouth in the morning., Disp: , Rfl:    naphazoline-pheniramine (VISINE) 0.025-0.3 % ophthalmic solution, Place 1-2 drops into both eyes 2 (two) times daily as needed (irritated dry/eyes)., Disp: , Rfl:    phentermine (ADIPEX-P) 37.5 MG tablet, Take 37.5 mg by mouth daily before breakfast., Disp: , Rfl:    pramipexole (MIRAPEX) 1 MG tablet, Take 1 mg by mouth at bedtime., Disp: , Rfl:    rosuvastatin (CRESTOR) 20 MG tablet, Take 20 mg by mouth at bedtime., Disp: , Rfl:    zolpidem (AMBIEN) 10 MG tablet, Take 5-10 mg by mouth at bedtime., Disp: , Rfl:  Past Medical History:  Diagnosis Date   Allergic rhinitis    Angina pectoris (HCC) 09/19/2022   Anxiety    B12 deficiency    Bradycardia    CAD in native artery 09/19/2022   Dry eyes and mouth    Elevated cholesterol    Fatty liver    Gallbladder problem    H/O bilateral breast reduction surgery    Heartburn    Hypertension    Insomnia    Joint pain    Lupus    Mild tricuspid regurgitation    Raynaud's syndrome    Restless leg syndrome    Seasonal allergies    Sicca syndrome (HCC)    Sjogren's disease (HCC)    SOB (shortness of breath)    Thyroid  disease    Vitamin D  deficiency    Past Surgical History:  Procedure Laterality Date   ANUS SURGERY  08/2018   Anal Gland   BREAST REDUCTION SURGERY     CHOLECYSTECTOMY     LAPAROSCOPIC APPENDECTOMY N/A 01/09/2020   Procedure: APPENDECTOMY LAPAROSCOPIC;  Surgeon: Teresa Lonni HERO, MD;  Location: [facility];  Service: General;  Laterality: N/A;   LEFT HEART CATH AND CORONARY ANGIOGRAPHY N/A 09/26/2022   Procedure: LEFT HEART CATH AND CORONARY ANGIOGRAPHY;  Surgeon: Dann Candyce RAMAN, MD;  Location: [facility];  Service: Cardiovascular;  Laterality: N/A;   NASAL SINUS SURGERY     REDUCTION MAMMAPLASTY Bilateral    TONSILLECTOMY     Family History  Problem Relation Age of Onset   Thyroid disease Mother    Hyperlipidemia Mother    Hypertension Mother    Heart disease Father    Diabetes Father    Heart attack Father    Hyperlipidemia Father    Hypertension Father    Obesity Father    Hypertension Brother    Diabetes Brother    Heart disease Brother    Coronary artery disease Brother    Hypertension Brother    Hypertension Paternal Grandmother    Diabetes Paternal Grandmother    Heart attack Paternal Grandfather    Social History   Socioeconomic History   Marital status: Married    Spouse name: jerry   Number of children: 1   Years of education: college   Highest education level: Not on file  Occupational History   Occupation: [employer]   Occupation: Part Time/ Retired  Tobacco Use   Smoking status: Never   Smokeless tobacco: Never  Substance and Sexual Activity   Alcohol use: Yes    Alcohol/week: 0.0 standard drinks of alcohol    Comment: rarely   Drug use: No   Sexual activity: Not on file  Other Topics Concern   Not on file  Social History Narrative  Not on file   Social Drivers of Health   Financial Resource Strain: Not on file  Food Insecurity: Not on file  Transportation Needs: Not on file  Physical Activity: Not on file   Stress: Not on file  Social Connections: Unknown (05/18/2021)   [facility]   Social Network    Social Network: Not on file  Intimate Partner Violence: Unknown (04/09/2021)   Received from [facility]   HITS    Physically Hurt: Not on file    Insult or Talk Down To: Not on file    Threaten Physical Harm: Not on file    Scream or Curse: Not on file       Objective:  BP (!) 148/74 (BP Location: Left Arm, Patient Position: Sitting)   Pulse 66   Ht 5' 1 (1.549 m)   Wt 155 lb (70.3 kg)   SpO2 98%   BMI 29.29 kg/m  SpO2 Readings from Last 3 Encounters:  08/14/23 98%  09/26/22 96%  05/16/22 96%    Physical Exam General: NAD, alert, WD, WN Eyes: PERRL, no scleral icterus ENMT: oropharynx clear, good dentition, no oral lesions, mallampati score IV Skin: warm, intact, no rashes Neck: JVD flat, no adenopathy CV: RRR, no MRG, nl S1 and S2, no peripheral edema Resp: clear to auscultation bilaterally, no wheezes, rales, or rhonchi, normal effort, no clubbing/cyanosis Abdom: Normoactive bowel sounds, soft, nontender, nondistended, no hepatosplenomegaly Ext: edema Neuro: Awake alert oriented to person place time and situation   Diagnostic Review:  Last CBC Lab Results  Component Value Date   WBC 4.7 09/19/2022   HGB 13.9 09/19/2022   HCT 42.3 09/19/2022   MCV 91 09/19/2022   MCH 29.9 09/19/2022   RDW 12.5 09/19/2022   PLT 281 09/19/2022   Last metabolic panel Lab Results  Component Value Date   GLUCOSE 93 09/19/2022   NA 140 09/19/2022   K 4.2 09/19/2022   CL 103 09/19/2022   CO2 21 09/19/2022   BUN 15 09/19/2022   CREATININE 0.86 09/19/2022   EGFR 76 09/19/2022   CALCIUM 10.3 09/19/2022   PROT 7.7 11/21/2021   ALBUMIN 4.4 04/17/2022   LABGLOB 3.0 11/21/2021   AGRATIO 1.6 11/21/2021   BILITOT 0.4 11/21/2021   ALKPHOS 63 11/21/2021   AST 24 04/17/2022   ALT 30 04/17/2022   ANIONGAP 11 01/09/2020   I reviewed the patient's CT coronaries  performed 07/2022 that showed a spiculated RLL pulmonary nodule measuring around 9 x 7 mm. Repeat CT chest on 07/2023 shows evidence of enlargement of RLL solitary pulmonary nodule now measuring 14 x 10 mm. There are also bilateral upper lobe spiculated pulmonary nodules, one subpleural on RUL measuring 10 x 7 mm. The other is in LUL measuring 7 x 6 mm. She has multiple cysts in her lungs that are scattered and have variable sizes. LIP pattern.    Assessment & Plan:   Assessment & Plan Multiple lung nodules The patient has multiple bilateral pulmonary nodules. The most concerning and largest of which is a RLL pulmonary nodule that was initially identified on CT coronaries in July 2024 and has increased in size on most recent CT chest 07/2023 now measuring 14 x 10 mm. The patient has hx of lupus and is on immunosuppression with hydroxychloroquine  so differential diagnosis includes indolent infections such as fungal and mycobacterial infections. The patient is a non-smoker and so adenocarcinoma is on the list as well. The patient will need to undergo  PET/CT and super D CT. After that, she will need to get navigational bronchoscopy with Dr. Shelah. I discussed the risks/benefits of procedure in great detail with patient and she agreed to pursue the plan above. I will see her in a month to discuss results of biopsy. LIP (lymphoid interstitial pneumonitis) (HCC) The patient has hx of Sjogren's syndrome and has multiple cysts of different sizes. This is likely LIP. Will obtain PFTs and monitor her lung function serially. At this time, there's no need for intervention.   Orders Placed This Encounter  Procedures   Procedural/ Surgical Case Request: VIDEO BRONCHOSCOPY WITH ENDOBRONCHIAL NAVIGATION   NM PET SUPER D CT   NM PET Image Initial (PI) Skull Base To Thigh (F-18 FDG)   CBC with Differential/Platelet   Protime-INR   Basic metabolic panel with GFR   Pulmonary Function Test   I spent 75 minutes reviewing  patient's chart including prior consultant notes, imaging, and PFTs as well as face-to-face with the patient, over half in discussion of the diagnosis and the importance of compliance with the treatment plan.  Return in about 1 month (around 09/14/2023).   Favour Aleshire, MD            Britain Saber, MD

## 2023-08-14 NOTE — Addendum Note (Signed)
 Addended by: Toriann Spadoni on: 08/14/2023 04:00 PM   Modules accepted: Orders

## 2023-08-14 NOTE — Patient Instructions (Addendum)
-   Please perform PET/CT and Super D CT within a week - Please pursue bronchoscopy. Likely tentative date on 8/18 or 8/19 - Dr. Shelah will meet you on day of procedure - You will need to fast after midnight on night before procedure - Pease hold baby aspirin  5 days prior to procedure. - Come back to clinic in a month to review results

## 2023-08-15 ENCOUNTER — Ambulatory Visit: Payer: Self-pay | Admitting: Pulmonary Disease

## 2023-08-15 ENCOUNTER — Ambulatory Visit (HOSPITAL_COMMUNITY)
Admission: RE | Admit: 2023-08-15 | Discharge: 2023-08-15 | Disposition: A | Discharge status: Home / Self Care | Source: Ambulatory Visit | Attending: Pulmonary Disease | Admitting: Pulmonary Disease

## 2023-08-15 DIAGNOSIS — J984 Other disorders of lung: Secondary | ICD-10-CM | POA: Diagnosis not present

## 2023-08-15 DIAGNOSIS — I700 Atherosclerosis of aorta: Secondary | ICD-10-CM | POA: Insufficient documentation

## 2023-08-15 DIAGNOSIS — R918 Other nonspecific abnormal finding of lung field: Secondary | ICD-10-CM | POA: Insufficient documentation

## 2023-08-15 LAB — CBC WITH DIFFERENTIAL/PLATELET
Basophils Absolute: 0 x10E3/uL (ref 0.0–0.2)
Basos: 1 %
EOS (ABSOLUTE): 0.1 x10E3/uL (ref 0.0–0.4)
Eos: 2 %
Hematocrit: 39.3 % (ref 34.0–46.6)
Hemoglobin: 13 g/dL (ref 11.1–15.9)
Immature Grans (Abs): 0 x10E3/uL (ref 0.0–0.1)
Immature Granulocytes: 0 %
Lymphocytes Absolute: 1.4 x10E3/uL (ref 0.7–3.1)
Lymphs: 34 %
MCH: 30 pg (ref 26.6–33.0)
MCHC: 33.1 g/dL (ref 31.5–35.7)
MCV: 91 fL (ref 79–97)
Monocytes Absolute: 0.3 x10E3/uL (ref 0.1–0.9)
Monocytes: 7 %
Neutrophils Absolute: 2.2 x10E3/uL (ref 1.4–7.0)
Neutrophils: 56 %
Platelets: 271 x10E3/uL (ref 150–450)
RBC: 4.34 x10E6/uL (ref 3.77–5.28)
RDW: 12.9 % (ref 11.7–15.4)
WBC: 4 x10E3/uL (ref 3.4–10.8)

## 2023-08-15 LAB — PROTIME-INR
INR: 0.9 (ref 0.9–1.2)
Prothrombin Time: 10.3 s (ref 9.1–12.0)

## 2023-08-15 LAB — GLUCOSE, CAPILLARY: Glucose-Capillary: 87 mg/dL (ref 70–99)

## 2023-08-15 MED ORDER — FLUDEOXYGLUCOSE F - 18 (FDG) INJECTION
7.7400 | Freq: Once | INTRAVENOUS | Status: AC
  Administered 2023-08-15: 7.74 via INTRAVENOUS

## 2023-08-15 NOTE — Assessment & Plan Note (Signed)
 The patient has multiple bilateral pulmonary nodules. The most concerning and largest of which is a RLL pulmonary nodule that was initially identified on CT coronaries in July 2024 and has increased in size on most recent CT chest 07/2023 now measuring 14 x 10 mm. The patient has hx of lupus and is on immunosuppression with hydroxychloroquine  so differential diagnosis includes indolent infections such as fungal and mycobacterial infections. The patient is a non-smoker and so adenocarcinoma is on the list as well. The patient will need to undergo PET/CT and super D CT. After that, she will need to get navigational bronchoscopy with Dr. Shelah. I discussed the risks/benefits of procedure in great detail with patient and she agreed to pursue the plan above. I will see her in a month to discuss results of biopsy.

## 2023-08-18 ENCOUNTER — Telehealth (HOSPITAL_BASED_OUTPATIENT_CLINIC_OR_DEPARTMENT_OTHER): Payer: Self-pay

## 2023-08-18 NOTE — Telephone Encounter (Signed)
 Pt will do biopsy

## 2023-08-18 NOTE — Telephone Encounter (Signed)
 Copied from CRM [id number]. Topic: Clinical - Medical Advice >> Aug 18, 2023  1:42 PM Joesph PARAS wrote: Reason for CRM: Patient is calling to inquire if she needs to do her PFT before or after her scheduled biopsy. Please advise patient.

## 2023-08-18 NOTE — Telephone Encounter (Signed)
 Pt.notified

## 2023-08-22 ENCOUNTER — Encounter (HOSPITAL_COMMUNITY): Payer: Self-pay | Admitting: Emergency Medicine

## 2023-08-22 ENCOUNTER — Other Ambulatory Visit: Payer: Self-pay

## 2023-08-22 NOTE — Progress Notes (Addendum)
 PCP - Alberta Sharps, MD Cardiologist - Annabella Scarce, MD-Dr. Scarce has signed off on the pt. Only needs to return if any new symptoms come up.  EKG - 09/19/22 Stress Test - 06/21/14 ECHO - 06/13/14 Cardiac Cath - 09/26/22  Aspirin  Instructions: Stop aspirin  5 days prior to procedure last dose 8/11  Anesthesia review: Y  Patient verbally denies any shortness of breath, fever, cough and chest pain during phone call   -------------  SDW INSTRUCTIONS given:  Your procedure is scheduled on Monday, Aug 18th.  Report to [facility] Main Entrance A at 0900 A.M., and check in at the Admitting office.  Call this number if you have problems the morning of surgery:  [phone]   Remember:  Do not eat or drink after midnight the night before your surgery    Take these medicines the morning of surgery with A SIP OF WATER  hydroxychloroquine  (PLAQUENIL )  levothyroxine  (SYNTHROID )  acetaminophen  (TYLENOL )-if needed albuterol  (VENTOLIN  HFA)-if needed (Please bring on the day of surgery) fluticasone (FLONASE)-if needed VISINE-if needed  **phentermine (ADIPEX-P)** Stop as of today please.   As of today, STOP taking any Aspirin  (unless otherwise instructed by your surgeon) Aleve, CELEBREX, Naproxen, Ibuprofen , Motrin , Advil , Goody's, BC's, all herbal medications, fish oil, and all vitamins.                      Do not wear jewelry, make up, or nail polish            Do not wear lotions, powders, perfumes/colognes, or deodorant.            Do not shave 48 hours prior to surgery.  Men may shave face and neck.            Do not bring valuables to the hospital.            [facility] is not responsible for any belongings or valuables.  Do NOT Smoke (Tobacco/Vaping) 24 hours prior to your procedure If you use a CPAP at night, you may bring all equipment for your overnight stay.   Contacts, glasses, dentures or bridgework may not be worn into surgery.      For patients admitted to  the hospital, discharge time will be determined by your treatment team.   Patients discharged the day of surgery will not be allowed to drive home, and someone needs to stay with them for 24 hours.    Special instructions:   [place]- Preparing For Surgery  Before surgery, you can play an important role. Because skin is not sterile, your skin needs to be as free of germs as possible. You can reduce the number of germs on your skin by washing with CHG (chlorahexidine gluconate) Soap before surgery.  CHG is an antiseptic cleaner which kills germs and bonds with the skin to continue killing germs even after washing.    Oral Hygiene is also important to reduce your risk of infection.  Remember - BRUSH YOUR TEETH THE MORNING OF SURGERY WITH YOUR REGULAR TOOTHPASTE  Please do not use if you have an allergy to CHG or antibacterial soaps. If your skin becomes reddened/irritated stop using the CHG.  Do not shave (including legs and underarms) for at least 48 hours prior to first CHG shower. It is OK to shave your face.  Please follow these instructions carefully.   Shower the NIGHT BEFORE SURGERY and the MORNING OF SURGERY with DIAL Soap.   Pat yourself dry with  a CLEAN TOWEL.  Wear CLEAN PAJAMAS to bed the night before surgery  Place CLEAN SHEETS on your bed the night of your first shower and DO NOT SLEEP WITH PETS.   Day of Surgery: Please shower morning of surgery  Wear Clean/Comfortable clothing the morning of surgery Do not apply any deodorants/lotions.   Remember to brush your teeth WITH YOUR REGULAR TOOTHPASTE.   Questions were answered. Patient verbalized understanding of instructions.

## 2023-08-22 NOTE — Progress Notes (Signed)
 Anesthesia Chart Review: Jill Mcintosh  Case: [id number] Date/Time: 08/25/23 1130   Procedure: VIDEO BRONCHOSCOPY WITH ENDOBRONCHIAL NAVIGATION (Bilateral)   Anesthesia type: General   Diagnosis: Multiple lung nodules [R91.8]   Pre-op diagnosis: Multiple Pulmonary Nodules. RLL lung nodule enlarging. Rule out Malignancy.   [facility] / [facility]   Surgeons: Shelah Lamar RAMAN, MD       DISCUSSION: Patient is a 63 year old female scheduled for the above procedure. She had an incidental finding of a 5 mm RLL lung nodule on CT for coronary calcium scoring in July 2024. Follow-up chest CT in July 2025 showed enlarged RLL nodule and multiple thin walled cysts throughout, with constellation of findings suggestive of cystic lung disease such as lymphoid interstitial pneumonia, Langerhans cell histiocytosis, or lymphangioleiomyomatosis. Associated nodules may be a component of the disease or incidental and opportunistic infectious or inflammatory. RLL nodules measured 12 mm on 08/15/23 PET scan, mild FDG uptake but not hypermetabolic. Of note, she has known SLE and is on immunosuppressive therapy. Pulmonologist Dr. Catherine referred her for navigational bronchoscopy.   History includes never smoker, HTN, CAD (minimal CAD 09/2022 LHC), bradycardia, dyspnea, SLE, Sjogrens' disease/Sicca syndrome, Raynauds' syndrome, fatty liver, pre-diabetes, anxiety.   She had cardiology evaluation by Dr. Annabella Scarce in 2024 for DOE, chest tightness with elevated CAC (177, 91st percentile) in July 2024. Previously seen by Dr. Wilbert Bihari in 2016 for bradycardia with mild positional dizziness and had reassuring TTE, Holter monitor and stress test then. Given new symptoms of worsening dyspnea with elevated CAC, decision made to proceed with LHC on 09/23/22. Results showed minimal CAD, EF 55-65%, no AS. Continue preventive therapy recommended. She reported as needed follow-up.   Last ASA 08/18/23.  Last phentermine 08/22/23.   Anesthesia team to evaluate on the day of surgery.    VS: Ht 5' 0.98 (1.549 m)   Wt 70.3 kg   BMI 29.30 kg/m  BP Readings from Last 3 Encounters:  08/14/23 (!) 148/74  09/26/22 (!) 102/57  09/19/22 112/64   Pulse Readings from Last 3 Encounters:  08/14/23 66  09/26/22 68  09/19/22 75    PROVIDERS: Claudene Pellet, MD is PCP  Scarce Annabella, MD is cardiologist Catherine Cools, MD is pulmonologist Ishmael Slough, MD is rheumatologist   LABS: For day of surgery. Normal CBC, PT/INR, and glucose 87 on 08/14/23. Cr 0.734, AST 29, ALT 33, A1c 5.5% on 10/03/22 (CE)   IMAGES: PET/CT 08/15/23: IMPRESSION: 1. Imaging for electromagnetic bronchoscopy planning. 2. No significant change in multiple thin walled pulmonary cysts, some of which are associated with irregular mural nodules. No new or enlarging nodules identified. CT same day PET-CT report. 3. No adenopathy or pleural effusion. 4.  Aortic Atherosclerosis (ICD10-I70.0).   CT Chest 07/28/23: IMPRESSION: 1. Multiple thin walled cysts scattered throughout the lungs; several of these have associated irregular nodules, including a previously identified nodule in the superior segment right lower lobe. This has enlarged when compared to prior examination, on today's examination measuring 1.4 x 1.0 cm, previously 1.1 x 0.7 cm. Multiple additional smaller nodules. Constellation of findings is suggestive of cystic lung disease such as lymphoid interstitial pneumonia, Langerhans cell histiocytosis, or lymphangioleiomyomatosis. Associated nodules may be a component of the disease or incidental and opportunistic infectious or inflammatory. 2. Diffuse bilateral bronchial wall thickening, consistent with nonspecific infectious or inflammatory bronchitis. 3. Coronary artery disease. - Aortic Atherosclerosis (ICD10-I70.0).   EKG: 09/19/22: Normal sinus rhythm Low voltage QRS  Since last tracing rate  slower Confirmed by Raford Riggs ([id number]) on 09/19/2022 1:52:10 PM   CV: Cardiac cath 09/26/22:   The left ventricular systolic function is normal.   LV end diastolic pressure is normal.   The left ventricular ejection fraction is 55-65% by visual estimate.   There is no aortic valve stenosis.   In the absence of any other complications or medical issues, we expect the patient to be ready for discharge from a cath perspective on 09/26/2022.   Minimal, nonobstructive coronary atherosclerosis. Continue preventive therapy.    CT Coronary Calcium Scoring 07/24/22: IMPRESSION: - Coronary calcium score of 177. This was 91 percentile for age-,race-, and sex-matched controls. - Aortic atherosclerosis   RECOMMENDATIONS:... - If CAC is >=100 or >=75th percentile, it is reasonable to initiate statin therapy at any age. - Cardiology referral should be considered for patients with CAC scores >=400 or >=75th percentile...   Echo 06/13/14: Study Conclusions  - Left ventricle: The cavity size was normal. Systolic function was    normal. The estimated ejection fraction was in the range of 60%    to 65%. Wall motion was normal; there were no regional wall    motion abnormalities. Doppler parameters are consistent with    abnormal left ventricular relaxation (grade 1 diastolic    dysfunction).  - Left atrium: The atrium was mildly dilated.    48 hour Holter monitor 06/13/14: Normal Sinus Rhythm with average heart rate 70 bpm. The heart rate averaged 51-113 bpm. Rare PVC was noted.  Past Medical History:  Diagnosis Date   Allergic rhinitis    Angina pectoris (HCC) 09/19/2022   Anxiety    B12 deficiency    Bradycardia    CAD in native artery 09/19/2022   Dry eyes and mouth    Elevated cholesterol    Fatty liver    Gallbladder problem    H/O bilateral breast reduction surgery    Heartburn    Hypertension    Hypothyroidism    Insomnia    Joint pain    Mild tricuspid regurgitation     Pneumonia    Pre-diabetes    Raynaud's syndrome    Restless leg syndrome    Seasonal allergies    Sicca syndrome (HCC)    Sjogren's disease (HCC)    SLE (systemic lupus erythematosus) (HCC)    SOB (shortness of breath)    Thyroid disease    Vitamin D  deficiency     Past Surgical History:  Procedure Laterality Date   ANUS SURGERY  08/2018   Anal Gland   BREAST REDUCTION SURGERY     CHOLECYSTECTOMY     LAPAROSCOPIC APPENDECTOMY N/A 01/09/2020   Procedure: APPENDECTOMY LAPAROSCOPIC;  Surgeon: Teresa Lonni HERO, MD;  Location: [facility];  Service: General;  Laterality: N/A;   LEFT HEART CATH AND CORONARY ANGIOGRAPHY N/A 09/26/2022   Procedure: LEFT HEART CATH AND CORONARY ANGIOGRAPHY;  Surgeon: Dann Candyce RAMAN, MD;  Location: [facility];  Service: Cardiovascular;  Laterality: N/A;   NASAL SINUS SURGERY     REDUCTION MAMMAPLASTY Bilateral    TONSILLECTOMY      MEDICATIONS: No current facility-administered medications for this encounter.    celecoxib (CELEBREX) 200 MG capsule   phentermine (ADIPEX-P) 37.5 MG tablet   acetaminophen  (TYLENOL ) 500 MG tablet   albuterol  (VENTOLIN  HFA) 108 (90 Base) MCG/ACT inhaler   aspirin  EC 81 MG tablet   cetirizine (ZYRTEC) 10 MG tablet   Cyanocobalamin (VITAMIN B-12 PO)  ferrous sulfate 325 (65 FE) MG tablet   fluticasone (FLONASE) 50 MCG/ACT nasal spray   hydroxychloroquine  (PLAQUENIL ) 200 MG tablet   levothyroxine  (SYNTHROID ) 125 MCG tablet   LINZESS 145 MCG CAPS capsule   lisinopril -hydrochlorothiazide  (ZESTORETIC ) 20-12.5 MG tablet   Multiple Vitamin (MULTIVITAMIN WITH MINERALS) TABS tablet   naphazoline-pheniramine (VISINE) 0.025-0.3 % ophthalmic solution   pramipexole (MIRAPEX) 1 MG tablet   rosuvastatin (CRESTOR) 20 MG tablet   zolpidem (AMBIEN) 10 MG tablet     Isaiah Ruder, PA-C Surgical Short Stay/Anesthesiology [facility] Phone [phone] [facility] Phone [phone] 08/22/2023 11:54 AM

## 2023-08-22 NOTE — Anesthesia Preprocedure Evaluation (Signed)
 Anesthesia Evaluation  Patient identified by MRN, date of birth, ID band Patient awake    Reviewed: Allergy & Precautions, H&P , NPO status , Patient's Chart, lab work & pertinent test results  Airway Mallampati: II   Neck ROM: full    Dental   Pulmonary neg pulmonary ROS   breath sounds clear to auscultation       Cardiovascular hypertension,  Rhythm:regular Rate:Normal     Neuro/Psych  PSYCHIATRIC DISORDERS Anxiety Depression       GI/Hepatic   Endo/Other  Hypothyroidism    Renal/GU      Musculoskeletal   Abdominal   Peds  Hematology   Anesthesia Other Findings   Reproductive/Obstetrics                              Anesthesia Physical Anesthesia Plan  ASA: 2  Anesthesia Plan: General   Post-op Pain Management:    Induction: Intravenous  PONV Risk Score and Plan: 3 and Ondansetron , Dexamethasone , Midazolam  and Treatment may vary due to age or medical condition  Airway Management Planned: Oral ETT  Additional Equipment:   Intra-op Plan:   Post-operative Plan: Extubation in OR  Informed Consent: I have reviewed the patients History and Physical, chart, labs and discussed the procedure including the risks, benefits and alternatives for the proposed anesthesia with the patient or authorized representative who has indicated his/her understanding and acceptance.     Dental advisory given  Plan Discussed with: CRNA, Anesthesiologist and Surgeon  Anesthesia Plan Comments: (PAT note written 08/22/2023 by Allison Zelenak, PA-C.  )         Anesthesia Quick Evaluation

## 2023-08-25 ENCOUNTER — Encounter (HOSPITAL_COMMUNITY): Payer: Self-pay | Admitting: Emergency Medicine

## 2023-08-25 ENCOUNTER — Ambulatory Visit (HOSPITAL_COMMUNITY)

## 2023-08-25 ENCOUNTER — Encounter (HOSPITAL_COMMUNITY)
Admission: RE | Disposition: A | Discharge status: Home / Self Care | Payer: Self-pay | Source: Home / Self Care | Attending: Emergency Medicine

## 2023-08-25 ENCOUNTER — Ambulatory Visit (HOSPITAL_COMMUNITY)
Admission: RE | Admit: 2023-08-25 | Discharge: 2023-08-25 | Disposition: A | Discharge status: Home / Self Care | Attending: Emergency Medicine | Admitting: Emergency Medicine

## 2023-08-25 ENCOUNTER — Other Ambulatory Visit: Payer: Self-pay

## 2023-08-25 ENCOUNTER — Ambulatory Visit (HOSPITAL_COMMUNITY): Payer: Self-pay | Admitting: Vascular Surgery

## 2023-08-25 DIAGNOSIS — R918 Other nonspecific abnormal finding of lung field: Secondary | ICD-10-CM | POA: Insufficient documentation

## 2023-08-25 DIAGNOSIS — Z79899 Other long term (current) drug therapy: Secondary | ICD-10-CM | POA: Diagnosis not present

## 2023-08-25 DIAGNOSIS — E039 Hypothyroidism, unspecified: Secondary | ICD-10-CM | POA: Insufficient documentation

## 2023-08-25 DIAGNOSIS — I10 Essential (primary) hypertension: Secondary | ICD-10-CM | POA: Diagnosis not present

## 2023-08-25 DIAGNOSIS — I7300 Raynaud's syndrome without gangrene: Secondary | ICD-10-CM | POA: Insufficient documentation

## 2023-08-25 DIAGNOSIS — Z7969 Long term (current) use of other immunomodulators and immunosuppressants: Secondary | ICD-10-CM | POA: Diagnosis not present

## 2023-08-25 DIAGNOSIS — R911 Solitary pulmonary nodule: Secondary | ICD-10-CM | POA: Diagnosis not present

## 2023-08-25 DIAGNOSIS — E785 Hyperlipidemia, unspecified: Secondary | ICD-10-CM | POA: Diagnosis not present

## 2023-08-25 DIAGNOSIS — M329 Systemic lupus erythematosus, unspecified: Secondary | ICD-10-CM | POA: Diagnosis not present

## 2023-08-25 DIAGNOSIS — R7303 Prediabetes: Secondary | ICD-10-CM | POA: Diagnosis not present

## 2023-08-25 DIAGNOSIS — M3509 Sicca syndrome with other organ involvement: Secondary | ICD-10-CM | POA: Diagnosis not present

## 2023-08-25 DIAGNOSIS — I2510 Atherosclerotic heart disease of native coronary artery without angina pectoris: Secondary | ICD-10-CM | POA: Diagnosis not present

## 2023-08-25 DIAGNOSIS — Z7982 Long term (current) use of aspirin: Secondary | ICD-10-CM | POA: Diagnosis not present

## 2023-08-25 HISTORY — DX: Pneumonia, unspecified organism: J18.9

## 2023-08-25 HISTORY — DX: Systemic lupus erythematosus, unspecified: M32.9

## 2023-08-25 HISTORY — DX: Prediabetes: R73.03

## 2023-08-25 HISTORY — DX: Hypothyroidism, unspecified: E03.9

## 2023-08-25 LAB — BASIC METABOLIC PANEL WITH GFR
Anion gap: 11 (ref 5–15)
BUN: 15 mg/dL (ref 8–23)
CO2: 24 mmol/L (ref 22–32)
Calcium: 9.6 mg/dL (ref 8.9–10.3)
Chloride: 106 mmol/L (ref 98–111)
Creatinine, Ser: 0.7 mg/dL (ref 0.44–1.00)
GFR, Estimated: >60 mL/min (ref >60)
Glucose, Bld: 99 mg/dL (ref 70–99)
Potassium: 3.4 mmol/L — ABNORMAL LOW (ref 3.5–5.1)
Sodium: 141 mmol/L (ref 135–145)

## 2023-08-25 SURGERY — VIDEO BRONCHOSCOPY WITH ENDOBRONCHIAL NAVIGATION
Anesthesia: General | Laterality: Bilateral

## 2023-08-25 MED ORDER — ONDANSETRON HCL 4 MG/2ML IJ SOLN
INTRAMUSCULAR | Status: DC | PRN
  Administered 2023-08-25: 4 mg via INTRAVENOUS

## 2023-08-25 MED ORDER — FENTANYL CITRATE (PF) 250 MCG/5ML IJ SOLN
INTRAMUSCULAR | Status: DC | PRN
  Administered 2023-08-25: 100 ug via INTRAVENOUS

## 2023-08-25 MED ORDER — PHENYLEPHRINE HCL (PRESSORS) 10 MG/ML IV SOLN
INTRAVENOUS | Status: DC | PRN
Start: 2023-08-25 — End: 2023-08-25
  Administered 2023-08-25 (×2): 80 ug via INTRAVENOUS

## 2023-08-25 MED ORDER — CHLORHEXIDINE GLUCONATE 0.12 % MT SOLN: 15.0000 mL | Freq: Once | OROMUCOSAL | Status: AC

## 2023-08-25 MED ORDER — LACTATED RINGERS IV SOLN: INTRAVENOUS | Status: DC

## 2023-08-25 MED ORDER — ONDANSETRON HCL 4 MG/2ML IJ SOLN: 4.0000 mg | Freq: Four times a day (QID) | INTRAMUSCULAR | Status: DC | PRN

## 2023-08-25 MED ORDER — CHLORHEXIDINE GLUCONATE 0.12 % MT SOLN
OROMUCOSAL | Status: AC
  Administered 2023-08-25: 15 mL via OROMUCOSAL
  Filled 2023-08-25: qty 15

## 2023-08-25 MED ORDER — ROCURONIUM BROMIDE 10 MG/ML (PF) SYRINGE
PREFILLED_SYRINGE | INTRAVENOUS | Status: DC | PRN
  Administered 2023-08-25: 60 mg via INTRAVENOUS
  Administered 2023-08-25 (×2): 10 mg via INTRAVENOUS

## 2023-08-25 MED ORDER — SUGAMMADEX SODIUM 200 MG/2ML IV SOLN
INTRAVENOUS | Status: DC | PRN
  Administered 2023-08-25: 350 mg via INTRAVENOUS

## 2023-08-25 MED ORDER — PROPOFOL 500 MG/50ML IV EMUL
INTRAVENOUS | Status: DC | PRN
  Administered 2023-08-25: 150 ug/kg/min via INTRAVENOUS

## 2023-08-25 MED ORDER — FENTANYL CITRATE (PF) 100 MCG/2ML IJ SOLN
INTRAMUSCULAR | Status: AC
Start: 2023-08-25 — End: 2023-08-25
  Filled 2023-08-25: qty 2

## 2023-08-25 MED ORDER — PROPOFOL 10 MG/ML IV BOLUS
INTRAVENOUS | Status: DC | PRN
  Administered 2023-08-25: 150 mg via INTRAVENOUS

## 2023-08-25 MED ORDER — FENTANYL CITRATE (PF) 100 MCG/2ML IJ SOLN: 25.0000 ug | INTRAMUSCULAR | Status: DC | PRN

## 2023-08-25 MED ORDER — LIDOCAINE HCL (CARDIAC) PF 100 MG/5ML IV SOSY
PREFILLED_SYRINGE | INTRAVENOUS | Status: DC | PRN
  Administered 2023-08-25: 60 mg via INTRATRACHEAL

## 2023-08-25 MED ORDER — OXYCODONE HCL 5 MG PO TABS: 5.0000 mg | ORAL_TABLET | Freq: Once | ORAL | Status: DC | PRN

## 2023-08-25 MED ORDER — DEXAMETHASONE SODIUM PHOSPHATE 10 MG/ML IJ SOLN
INTRAMUSCULAR | Status: DC | PRN
  Administered 2023-08-25: 10 mg via INTRAVENOUS

## 2023-08-25 MED ORDER — OXYCODONE HCL 5 MG/5ML PO SOLN: 5.0000 mg | Freq: Once | ORAL | Status: DC | PRN

## 2023-08-25 SURGICAL SUPPLY — 36 items
ADAPTER BRONCHOSCOPE OLYMPUS (ADAPTER) ×2 IMPLANT
ADAPTER VALVE BIOPSY EBUS (MISCELLANEOUS) IMPLANT
BAG COUNTER SPONGE SURGICOUNT (BAG) ×1 IMPLANT
BRUSH CYTOL CELLEBRITY 1.5X140 (MISCELLANEOUS) ×2 IMPLANT
BRUSH SUPERTRAX BIOPSY (INSTRUMENTS) IMPLANT
BRUSH SUPERTRAX NDL-TIP CYTO (INSTRUMENTS) ×2 IMPLANT
CANISTER SUCTION 3000ML PPV (SUCTIONS) ×1 IMPLANT
CNTNR URN SCR LID CUP LEK RST (MISCELLANEOUS) ×2 IMPLANT
COVER BACK TABLE 60X90IN (DRAPES) ×1 IMPLANT
FILTER STRAW FLUID ASPIR (MISCELLANEOUS) IMPLANT
FORCEPS BIOP 1.5 SINGLE USE (MISCELLANEOUS) ×2 IMPLANT
FORCEPS BIOP SUPERTRX PREMAR (INSTRUMENTS) ×1 IMPLANT
GAUZE SPONGE 4X4 12PLY STRL (GAUZE/BANDAGES/DRESSINGS) ×2 IMPLANT
GLOVE BIO SURGEON STRL SZ7.5 (GLOVE) ×2 IMPLANT
GOWN STRL REUS W/ TWL LRG LVL3 (GOWN DISPOSABLE) ×4 IMPLANT
KIT CLEAN ENDO COMPLIANCE (KITS) ×1 IMPLANT
KIT LOCATABLE GUIDE (CANNULA) IMPLANT
KIT MARKER FIDUCIAL DELIVERY (KITS) IMPLANT
KIT TURNOVER KIT B (KITS) ×1 IMPLANT
MARKER SKIN DUAL TIP RULER LAB (MISCELLANEOUS) ×2 IMPLANT
NDL SUPERTRX PREMARK BIOPSY (NEEDLE) ×2 IMPLANT
NEEDLE SUPERTRX PREMARK BIOPSY (NEEDLE) ×1 IMPLANT
NS IRRIG 1000ML POUR BTL (IV SOLUTION) ×1 IMPLANT
OIL SILICONE PENTAX (PARTS (SERVICE/REPAIRS)) ×2 IMPLANT
PAD ARMBOARD POSITIONER FOAM (MISCELLANEOUS) ×4 IMPLANT
PATCHES PATIENT (LABEL) ×3 IMPLANT
SYR 20ML ECCENTRIC (SYRINGE) ×2 IMPLANT
SYR 20ML LL LF (SYRINGE) ×1 IMPLANT
SYR 50ML SLIP (SYRINGE) ×1 IMPLANT
TOWEL GREEN STERILE FF (TOWEL DISPOSABLE) ×1 IMPLANT
TRAP SPECIMEN MUCUS 40CC (MISCELLANEOUS) IMPLANT
TUBE CONNECTING 20X1/4 (TUBING) ×1 IMPLANT
UNDERPAD 30X36 HEAVY ABSORB (UNDERPADS AND DIAPERS) ×2 IMPLANT
VALVE BIOPSY SINGLE USE (MISCELLANEOUS) ×2 IMPLANT
VALVE SUCTION BRONCHIO DISP (MISCELLANEOUS) ×1 IMPLANT
WATER STERILE IRR 1000ML POUR (IV SOLUTION) ×1 IMPLANT

## 2023-08-25 NOTE — Op Note (Signed)
 Video Bronchoscopy with Robotic Assisted Bronchoscopic Navigation   Date of Operation: 08/25/2023   Pre-op Diagnosis: Bilateral pulmonary nodules  Post-op Diagnosis: Same  Surgeon: Lamar Chris  Assistants: None  Anesthesia: General endotracheal anesthesia  Operation: Flexible video fiberoptic bronchoscopy with robotic assistance and biopsies.  Estimated Blood Loss: Minimal  Complications: None  Indications and History: Jill Mcintosh is a 63 y.o. female with history of immunosuppression on Plaquenil  found to have small bilateral pulmonary nodules.  Recommendation made to achieve a tissue diagnosis and culture information via robotic assisted navigational bronchoscopy.  The risks, benefits, complications, treatment options and expected outcomes were discussed with the patient.  The possibilities of pneumothorax, pneumonia, reaction to medication, pulmonary aspiration, perforation of a viscus, bleeding, failure to diagnose a condition and creating a complication requiring transfusion or operation were discussed with the patient who freely signed the consent.    Description of Procedure: The patient was seen in the Preoperative Area, was examined and was deemed appropriate to proceed.  The patient was taken to [facility] Endoscopy room 3, identified as Jill Mcintosh and the procedure verified as Flexible Video Fiberoptic Bronchoscopy.  A Time Out was held and the above information confirmed.   Prior to the date of the procedure a high-resolution CT scan of the chest was performed. Utilizing ION software program a virtual tracheobronchial tree was generated to allow the creation of distinct navigation pathways to the patient's parenchymal abnormalities. After being taken to the operating room general anesthesia was initiated and the patient  was orally intubated. The video fiberoptic bronchoscope was introduced via the endotracheal tube and a general inspection was performed which showed  normal right and left lung anatomy. Aspiration of the bilateral mainstems was completed to remove any remaining secretions. Robotic catheter inserted into patient's endotracheal tube.   Target #1 right lower lobe superior segment nodule: The distinct navigation pathways prepared prior to this procedure were then utilized to navigate to patient's lesion identified on CT scan. The robotic catheter was secured into place and the vision probe was withdrawn.  Lesion location was approximated using fluoroscopy.  Local registration and targeting was performed using [employer] Cios mobile C-arm three-dimensional imaging. Under fluoroscopic guidance transbronchial brushings, transbronchial needle biopsies, and transbronchial forceps biopsies were performed to be sent for cytology and pathology.  Needle-in-lesion was confirmed using Cios mobile C-arm.  A bronchioalveolar lavage was performed in the right lower lobe and sent for microbiology.   Attempts were made to reach the right apical pulmonary nodule and the left apical pulmonary nodule, but these were technically difficult.  The catheter would not pass distally to allow for adequate targeting biopsy.  At the end of the procedure a general airway inspection was performed and there was no evidence of active bleeding. The bronchoscope was removed.  The patient tolerated the procedure well. There was no significant blood loss and there were no obvious complications. A post-procedural chest x-ray is pending.  Samples Target #1: 1. Transbronchial brushings from right lower lobe superior segment nodule 2. Transbronchial Wang needle biopsies from right lower lobe superior segment nodule 3. Transbronchial forceps biopsies from right lower lobe superior segment nodule 4. Bronchoalveolar lavage from right lower lobe    Plans:  The patient will be discharged from the PACU to home when recovered from anesthesia and after chest x-ray is reviewed. We will  review the cytology, pathology and microbiology results with the patient when they become available. Outpatient followup will be with Dr Catherine.  Lamar Chris, MD, PhD 08/25/2023, 12:53 PM [facility] [phone] or if no answer before 7:00PM call [phone] For any issues after 7:00PM please call eLink [phone]

## 2023-08-25 NOTE — Transfer of Care (Signed)
 Immediate Anesthesia Transfer of Care Note  Patient: Jill Mcintosh  Procedure(s) Performed: VIDEO BRONCHOSCOPY WITH ENDOBRONCHIAL NAVIGATION (Bilateral)  Patient Location: PACU  Anesthesia Type:General  Level of Consciousness: awake, alert , and oriented  Airway & Oxygen Therapy: Patient Spontanous Breathing and Patient connected to face mask oxygen  Post-op Assessment: Report given to RN and Post -op Vital signs reviewed and stable  Post vital signs: Reviewed and stable  Last Vitals:  Vitals Value Taken Time  BP 112/71 08/25/23 12:54  Temp 37.4 C 08/25/23 12:54  Pulse 62 08/25/23 12:54  Resp 15 08/25/23 12:54  SpO2 100 % 08/25/23 12:54    Last Pain:  Vitals:   08/25/23 0917  TempSrc:   PainSc: 7       Patients Stated Pain Goal: 4 (08/25/23 0917)  Complications: No notable events documented.

## 2023-08-25 NOTE — Anesthesia Procedure Notes (Signed)
 Procedure Name: Intubation Date/Time: 08/25/2023 11:46 AM  Performed by: Canda Augustin KIDD, CRNAPre-anesthesia Checklist: Patient identified, Emergency Drugs available, Suction available and Patient being monitored Patient Re-evaluated:Patient Re-evaluated prior to induction Oxygen Delivery Method: Circle system utilized Preoxygenation: Pre-oxygenation with 100% oxygen Induction Type: IV induction Ventilation: Mask ventilation without difficulty Laryngoscope Size: Mac and 3 Grade View: Grade I Tube type: Oral Tube size: 8.5 mm Number of attempts: 1 Placement Confirmation: ETT inserted through vocal cords under direct vision, positive ETCO2, CO2 detector and breath sounds checked- equal and bilateral Secured at: 22 cm Tube secured with: Tape Dental Injury: Teeth and Oropharynx as per pre-operative assessment

## 2023-08-25 NOTE — Interval H&P Note (Signed)
 History and Physical Interval Note:  08/25/2023 9:50 AM  Jill Mcintosh  has presented today for surgery, with the diagnosis of Multiple Pulmonary Nodules. RLL lung nodule enlarging. Rule out Malignancy..  The various methods of treatment have been discussed with the patient and family. After consideration of risks, benefits and other options for treatment, the patient has consented to  Procedure(s): VIDEO BRONCHOSCOPY WITH ENDOBRONCHIAL NAVIGATION (Bilateral) as a surgical intervention.  The patient's history has been reviewed, patient examined, no change in status, stable for surgery.  I have reviewed the patient's chart and labs.  Questions were answered to the patient's satisfaction.     Lamar GORMAN Chris

## 2023-08-25 NOTE — Op Note (Signed)
 Procedure Note  Patient: Jill Mcintosh  [employer] Cios mobile C-arm was utilized to identify and biopsy right lower lobe nodule.  Needle-in-lesion was confirmed using real-time Cios imaging, and images were uploaded to PACS.   Lamar Chris, MD, PhD 08/25/2023, 1:50 PM [place] [phone] or if no answer before 7:00PM call [phone] For any issues after 7:00PM please call eLink [phone]

## 2023-08-25 NOTE — Discharge Instructions (Addendum)

## 2023-08-26 LAB — CYTOLOGY - NON PAP

## 2023-08-26 NOTE — Anesthesia Postprocedure Evaluation (Signed)
 Anesthesia Post Note  Patient: Jill Mcintosh  Procedure(s) Performed: VIDEO BRONCHOSCOPY WITH ENDOBRONCHIAL NAVIGATION (Bilateral)     Patient location during evaluation: PACU Anesthesia Type: General Level of consciousness: awake and alert Pain management: pain level controlled Vital Signs Assessment: post-procedure vital signs reviewed and stable Respiratory status: spontaneous breathing, nonlabored ventilation, respiratory function stable and patient connected to nasal cannula oxygen Cardiovascular status: blood pressure returned to baseline and stable Postop Assessment: no apparent nausea or vomiting Anesthetic complications: no   No notable events documented.  Last Vitals:  Vitals:   08/25/23 1330 08/25/23 1345  BP: (!) 107/59 111/60  Pulse: (!) 58 (!) 57  Resp: 12 13  Temp:  36.7 C  SpO2: 98% 97%    Last Pain:  Vitals:   08/25/23 1345  TempSrc:   PainSc: 0-No pain                 Ridwan Bondy S

## 2023-08-27 ENCOUNTER — Encounter (HOSPITAL_COMMUNITY): Payer: Self-pay | Admitting: Emergency Medicine

## 2023-08-27 ENCOUNTER — Ambulatory Visit: Admitting: Acute Care

## 2023-08-27 LAB — CULTURE, BAL-QUANTITATIVE W GRAM STAIN
Culture: NO GROWTH
Gram Stain: NONE SEEN

## 2023-08-29 LAB — ACID FAST SMEAR (AFB, MYCOBACTERIA): Acid Fast Smear: NEGATIVE

## 2023-08-30 LAB — AEROBIC/ANAEROBIC CULTURE W GRAM STAIN (SURGICAL/DEEP WOUND)
Culture: NO GROWTH
Gram Stain: NONE SEEN

## 2023-09-01 ENCOUNTER — Ambulatory Visit (HOSPITAL_BASED_OUTPATIENT_CLINIC_OR_DEPARTMENT_OTHER): Admitting: Pulmonary Disease

## 2023-09-01 ENCOUNTER — Ambulatory Visit: Admitting: Acute Care

## 2023-09-05 ENCOUNTER — Ambulatory Visit (HOSPITAL_BASED_OUTPATIENT_CLINIC_OR_DEPARTMENT_OTHER): Admitting: Pulmonary Disease

## 2023-09-28 LAB — FUNGUS CULTURE WITH STAIN

## 2023-09-28 LAB — FUNGUS CULTURE RESULT

## 2023-09-28 LAB — FUNGAL ORGANISM REFLEX

## 2023-10-11 LAB — ACID FAST CULTURE WITH REFLEXED SENSITIVITIES (MYCOBACTERIA): Acid Fast Culture: NEGATIVE

## 2023-10-12 NOTE — Progress Notes (Unsigned)
 Established Patient Pulmonology Office Visit   Subjective:  Patient ID: Jill Mcintosh, female    DOB: 09-22-1960  MRN: [id number]  Referred by: Jill Pellet, MD  CC:  No chief complaint on file.   HPI Jill Mcintosh is a 63 y.o. female with HTN, HLD, non-obstructive CAD, hypothyroidism, systemic lupus erythematosus, Sjogren's with Raynaud's syndrome, who presents for initial evaluation in the setting of multiple pulmonary nodules.  Patient initially seen for incidental pulmonary nodules that were present on CT coronaries from July 2024. A subsequent CT chest 07/2023 showed enlargement. I recommended Navigational bronchoscopy with biopsy which was performed by Dr. Shelah. RLL superior segment nodule was navigated to and biopsied. Cytology was negative for malignancy. Microbiology negative for bacterial, mycobacterial and fungal infectious process.  ROS  Allergies: Darvon [propoxyphene]  Current Outpatient Medications:    acetaminophen  (TYLENOL ) 500 MG tablet, Take 2 tablets (1,000 mg total) by mouth every 8 (eight) hours as needed for mild pain or fever., Disp: , Rfl:    albuterol  (VENTOLIN  HFA) 108 (90 Base) MCG/ACT inhaler, Inhale 1-2 puffs into the lungs every 6 (six) hours as needed for wheezing or shortness of breath., Disp: , Rfl:    aspirin  EC 81 MG tablet, Take 81 mg by mouth in the morning. Swallow whole., Disp: , Rfl:    celecoxib (CELEBREX) 200 MG capsule, Take 200 mg by mouth in the morning., Disp: , Rfl:    cetirizine (ZYRTEC) 10 MG tablet, Take 10 mg by mouth at bedtime., Disp: , Rfl:    Cyanocobalamin (VITAMIN B-12 PO), Take by mouth., Disp: , Rfl:    ferrous sulfate 325 (65 FE) MG tablet, Take 325 mg by mouth in the morning., Disp: , Rfl:    fluticasone (FLONASE) 50 MCG/ACT nasal spray, Place 1 spray into both nostrils daily as needed for allergies or rhinitis., Disp: , Rfl:    hydroxychloroquine  (PLAQUENIL ) 200 MG tablet, Take 400 mg by mouth in the morning.,  Disp: , Rfl:    levothyroxine  (SYNTHROID ) 125 MCG tablet, Take 112 mcg by mouth daily before breakfast., Disp: , Rfl:    LINZESS 145 MCG CAPS capsule, Take 145 mcg by mouth daily as needed (constipation.)., Disp: , Rfl:    lisinopril -hydrochlorothiazide  (ZESTORETIC ) 20-12.5 MG tablet, Take 1 tablet by mouth in the morning., Disp: , Rfl:    Multiple Vitamin (MULTIVITAMIN WITH MINERALS) TABS tablet, Take 1 tablet by mouth in the morning., Disp: , Rfl:    naphazoline-pheniramine (VISINE) 0.025-0.3 % ophthalmic solution, Place 1-2 drops into both eyes 2 (two) times daily as needed (irritated dry/eyes)., Disp: , Rfl:    phentermine (ADIPEX-P) 37.5 MG tablet, Take 37.5 mg by mouth daily before breakfast., Disp: , Rfl:    pramipexole (MIRAPEX) 1 MG tablet, Take 1 mg by mouth at bedtime., Disp: , Rfl:    rosuvastatin (CRESTOR) 20 MG tablet, Take 20 mg by mouth at bedtime., Disp: , Rfl:    zolpidem (AMBIEN) 10 MG tablet, Take 5-10 mg by mouth at bedtime., Disp: , Rfl:  Past Medical History:  Diagnosis Date   Allergic rhinitis    Anxiety    B12 deficiency    Bradycardia    CAD in native artery 09/19/2022   LHC 09/26/22 for chest pain evaluation: Minimal CAD   Dry eyes and mouth    Elevated cholesterol    Fatty liver    Gallbladder problem    H/O bilateral breast reduction surgery    Heartburn    Hypertension  Hypothyroidism    Insomnia    Joint pain    Mild tricuspid regurgitation    no TR 06/13/14 TTE   Pneumonia    Pre-diabetes    Raynaud's syndrome    Restless leg syndrome    Seasonal allergies    Sicca syndrome    Sjogren's disease    SLE (systemic lupus erythematosus) (HCC)    SOB (shortness of breath)    Thyroid disease    Vitamin D  deficiency    Past Surgical History:  Procedure Laterality Date   ANUS SURGERY  08/2018   Anal Gland   BREAST REDUCTION SURGERY     CHOLECYSTECTOMY     LAPAROSCOPIC APPENDECTOMY N/A 01/09/2020   Procedure: APPENDECTOMY LAPAROSCOPIC;  Surgeon:  Jill Lonni HERO, MD;  Location: [facility];  Service: General;  Laterality: N/A;   LEFT HEART CATH AND CORONARY ANGIOGRAPHY N/A 09/26/2022   Procedure: LEFT HEART CATH AND CORONARY ANGIOGRAPHY;  Surgeon: Jill Candyce RAMAN, MD;  Location: [facility];  Service: Cardiovascular;  Laterality: N/A;   NASAL SINUS SURGERY     REDUCTION MAMMAPLASTY Bilateral    TONSILLECTOMY     VIDEO BRONCHOSCOPY WITH ENDOBRONCHIAL NAVIGATION Bilateral 08/25/2023   Procedure: VIDEO BRONCHOSCOPY WITH ENDOBRONCHIAL NAVIGATION;  Surgeon: Jill Lamar RAMAN, MD;  Location: [facility];  Service: Pulmonary;  Laterality: Bilateral;   Family History  Problem Relation Age of Onset   Thyroid disease Mother    Hyperlipidemia Mother    Hypertension Mother    Heart disease Father    Diabetes Father    Heart attack Father    Hyperlipidemia Father    Hypertension Father    Obesity Father    Hypertension Brother    Diabetes Brother    Heart disease Brother    Coronary artery disease Brother    Hypertension Brother    Hypertension Paternal Grandmother    Diabetes Paternal Grandmother    Heart attack Paternal Grandfather    Social History   Socioeconomic History   Marital status: Married    Spouse name: Jill Mcintosh   Number of children: 1   Years of education: college   Highest education level: Not on file  Occupational History   Occupation: [employer]   Occupation: Part Time/ Retired  Tobacco Use   Smoking status: Never   Smokeless tobacco: Never  Vaping Use   Vaping status: Never Used  Substance and Sexual Activity   Alcohol use: Yes    Alcohol/week: 0.0 standard drinks of alcohol    Comment: rarely   Drug use: No   Sexual activity: Not on file  Other Topics Concern   Not on file  Social History Narrative   Not on file   Social Drivers of Health   Financial Resource Strain: Not on file  Food Insecurity: Not on file  Transportation Needs: Not on file  Physical Activity: Not on file   Stress: Not on file  Social Connections: Unknown (05/18/2021)   Received from [facility]   Social Network    Social Network: Not on file  Intimate Partner Violence: Unknown (04/09/2021)   Received from [facility]   HITS    Physically Hurt: Not on file    Insult or Talk Down To: Not on file    Threaten Physical Harm: Not on file    Scream or Curse: Not on file       Objective:  There were no vitals taken for this visit. SpO2 Readings from Last 3 Encounters:  08/25/23 97%  08/14/23 98%  09/26/22 96%    Physical Exam General: NAD, alert, WD, WN Eyes: PERRL, no scleral icterus ENMT: oropharynx clear, good dentition, no oral lesions, mallampati score IV Skin: warm, intact, no rashes Neck: JVD flat, no adenopathy CV: RRR, no MRG, nl S1 and S2, no peripheral edema Resp: clear to auscultation bilaterally, no wheezes, rales, or rhonchi, normal effort, no clubbing/cyanosis Abdom: Normoactive bowel sounds, soft, nontender, nondistended, no hepatosplenomegaly Ext: edema Neuro: Awake alert oriented to person place time and situation   Diagnostic Review:  Last CBC Lab Results  Component Value Date   WBC 4.0 08/14/2023   HGB 13.0 08/14/2023   HCT 39.3 08/14/2023   MCV 91 08/14/2023   MCH 30.0 08/14/2023   RDW 12.9 08/14/2023   PLT 271 08/14/2023   Last metabolic panel Lab Results  Component Value Date   GLUCOSE 99 08/25/2023   NA 141 08/25/2023   K 3.4 (L) 08/25/2023   CL 106 08/25/2023   CO2 24 08/25/2023   BUN 15 08/25/2023   CREATININE 0.70 08/25/2023   EGFR 76 09/19/2022   CALCIUM 9.6 08/25/2023   PROT 7.7 11/21/2021   ALBUMIN 4.4 04/17/2022   LABGLOB 3.0 11/21/2021   AGRATIO 1.6 11/21/2021   BILITOT 0.4 11/21/2021   ALKPHOS 63 11/21/2021   AST 24 04/17/2022   ALT 30 04/17/2022   ANIONGAP 11 08/25/2023   I reviewed the patient's CT coronaries performed 07/2022 that showed a spiculated RLL pulmonary nodule measuring around 9 x 7 mm. Repeat CT chest on  07/2023 shows evidence of enlargement of RLL solitary pulmonary nodule now measuring 14 x 10 mm. There are also bilateral upper lobe spiculated pulmonary nodules, one subpleural on RUL measuring 10 x 7 mm. The other is in LUL measuring 7 x 6 mm. She has multiple cysts in her lungs that are scattered and have variable sizes. LIP pattern.  PET/CT 08/15/23: IMPRESSION: 12 mm spiculated nodule in superior segment of right lower lobe shows mild FDG uptake, but is not hypermetabolic. Low-grade bronchogenic carcinoma cannot be excluded.   Other sub-cm pulmonary nodules in both lungs remain stable in size and show no FDG uptake. These remain below size threshold for PET detection of malignancy. Continued follow-up by CT is recommended in 6 months.  Pathology 08/25/23: FINAL MICROSCOPIC DIAGNOSIS:  A.  RIGHT LUNG, LOWER LOBE, NODULE, FINE NEEDLE ASPIRATION:  - Negative for malignancy  - Benign bronchial cells and pulmonary macrophages  - Benign alveolated lung showing chronic inflammation and fibrosis  (cellblock)   B. RIGHT LUNG, LOWER LOBE, NODULE, BRUSHING:  - Negative for malignancy  - Scant cellularity; scattered benign bronchial cells      Assessment & Plan:   Assessment & Plan   No orders of the defined types were placed in this encounter.  I spent 75 minutes reviewing patient's chart including prior consultant notes, imaging, and PFTs as well as face-to-face with the patient, over half in discussion of the diagnosis and the importance of compliance with the treatment plan.  No follow-ups on file.   Mikaella Escalona, MD            Shahed Yeoman, MD

## 2023-10-13 ENCOUNTER — Ambulatory Visit (INDEPENDENT_AMBULATORY_CARE_PROVIDER_SITE_OTHER): Admitting: Pulmonary Disease

## 2023-10-13 ENCOUNTER — Encounter (HOSPITAL_BASED_OUTPATIENT_CLINIC_OR_DEPARTMENT_OTHER): Payer: Self-pay | Admitting: Pulmonary Disease

## 2023-10-13 VITALS — BP 138/66 | HR 66 | Ht 61.0 in | Wt 152.8 lb

## 2023-10-13 DIAGNOSIS — R918 Other nonspecific abnormal finding of lung field: Secondary | ICD-10-CM

## 2023-10-13 DIAGNOSIS — J984 Other disorders of lung: Secondary | ICD-10-CM

## 2023-10-13 NOTE — Patient Instructions (Signed)
-   Repeat CT chest in 6 months and then I'll see you!

## 2023-10-13 NOTE — Addendum Note (Signed)
 Addended by: Rebecca Cairns on: 10/13/2023 11:29 AM   Modules accepted: Level of Service

## 2023-10-13 NOTE — Assessment & Plan Note (Addendum)
 The patient has multiple bilateral pulmonary nodules. The most concerning and largest of which is a RLL pulmonary nodule that was initially identified on CT coronaries in July 2024 and has increased in size on most recent CT chest 07/2023 now measuring 14 x 10 mm. The patient has hx of lupus and is on immunosuppression with hydroxychloroquine . Infectious and malignant etiologies ruled out with recent bronchoscopy. PET/CT without avidity. I suspect this maybe related to autoimmune disease. We will follow up pulmonary nodules with CT chest in 6 months. The rest of the plan per below. I advised patient to discuss this with rheumatologist.
